# Patient Record
Sex: Male | Born: 1935 | Race: Black or African American | Hispanic: No | Smoking: Former smoker
Health system: Southern US, Community
[De-identification: ages and names within clinical notes are randomized; demographics above are authoritative.]

## PROBLEM LIST (undated history)

## (undated) DIAGNOSIS — J449 Chronic obstructive pulmonary disease, unspecified: Secondary | ICD-10-CM

## (undated) DIAGNOSIS — M545 Low back pain, unspecified: Secondary | ICD-10-CM

## (undated) DIAGNOSIS — F0390 Unspecified dementia without behavioral disturbance: Secondary | ICD-10-CM

## (undated) DIAGNOSIS — M1990 Unspecified osteoarthritis, unspecified site: Secondary | ICD-10-CM

## (undated) DIAGNOSIS — E7800 Pure hypercholesterolemia, unspecified: Secondary | ICD-10-CM

## (undated) DIAGNOSIS — Z789 Other specified health status: Secondary | ICD-10-CM

## (undated) DIAGNOSIS — C61 Malignant neoplasm of prostate: Secondary | ICD-10-CM

## (undated) DIAGNOSIS — Z52008 Unspecified donor, other blood: Secondary | ICD-10-CM

## (undated) DIAGNOSIS — K5730 Diverticulosis of large intestine without perforation or abscess without bleeding: Secondary | ICD-10-CM

## (undated) DIAGNOSIS — N281 Cyst of kidney, acquired: Secondary | ICD-10-CM

## (undated) DIAGNOSIS — K219 Gastro-esophageal reflux disease without esophagitis: Secondary | ICD-10-CM

## (undated) HISTORY — PX: OTHER SURGICAL HISTORY: SHX169

## (undated) HISTORY — DX: Cyst of kidney, acquired: N28.1

## (undated) HISTORY — DX: Low back pain: M54.5

## (undated) HISTORY — DX: Other specified health status: Z78.9

## (undated) HISTORY — DX: Unspecified donor, other blood: Z52.008

## (undated) HISTORY — DX: Diverticulosis of large intestine without perforation or abscess without bleeding: K57.30

## (undated) HISTORY — DX: Chronic obstructive pulmonary disease, unspecified: J44.9

## (undated) HISTORY — DX: Malignant neoplasm of prostate: C61

## (undated) HISTORY — DX: Unspecified osteoarthritis, unspecified site: M19.90

## (undated) HISTORY — DX: Gastro-esophageal reflux disease without esophagitis: K21.9

## (undated) HISTORY — DX: Pure hypercholesterolemia, unspecified: E78.00

## (undated) HISTORY — DX: Low back pain, unspecified: M54.50

---

## 2000-06-14 ENCOUNTER — Encounter: Payer: Self-pay | Admitting: Otolaryngology

## 2000-06-14 ENCOUNTER — Ambulatory Visit (HOSPITAL_COMMUNITY): Admission: RE | Admit: 2000-06-14 | Discharge: 2000-06-14 | Payer: Self-pay | Admitting: Otolaryngology

## 2002-02-10 ENCOUNTER — Encounter: Payer: Self-pay | Admitting: Emergency Medicine

## 2002-02-10 ENCOUNTER — Emergency Department (HOSPITAL_COMMUNITY): Admission: EM | Admit: 2002-02-10 | Discharge: 2002-02-10 | Payer: Self-pay | Admitting: Emergency Medicine

## 2003-12-26 ENCOUNTER — Ambulatory Visit (HOSPITAL_COMMUNITY): Admission: RE | Admit: 2003-12-26 | Discharge: 2003-12-26 | Payer: Self-pay | Admitting: Pulmonary Disease

## 2004-05-03 ENCOUNTER — Ambulatory Visit: Payer: Self-pay | Admitting: Pulmonary Disease

## 2004-10-31 ENCOUNTER — Ambulatory Visit: Payer: Self-pay | Admitting: Pulmonary Disease

## 2005-05-01 ENCOUNTER — Ambulatory Visit: Payer: Self-pay | Admitting: Pulmonary Disease

## 2005-10-21 ENCOUNTER — Ambulatory Visit: Payer: Self-pay | Admitting: Pulmonary Disease

## 2005-12-15 ENCOUNTER — Emergency Department (HOSPITAL_COMMUNITY): Admission: EM | Admit: 2005-12-15 | Discharge: 2005-12-15 | Payer: Self-pay | Admitting: Emergency Medicine

## 2005-12-16 ENCOUNTER — Ambulatory Visit: Payer: Self-pay | Admitting: Pulmonary Disease

## 2006-03-07 ENCOUNTER — Ambulatory Visit: Payer: Self-pay | Admitting: Pulmonary Disease

## 2006-04-23 ENCOUNTER — Ambulatory Visit: Payer: Self-pay | Admitting: Pulmonary Disease

## 2006-04-25 LAB — CONVERTED CEMR LAB
Albumin: 3.9 g/dL (ref 3.5–5.2)
BUN: 10 mg/dL (ref 6–23)
CO2: 33 meq/L — ABNORMAL HIGH (ref 19–32)
Calcium: 9.8 mg/dL (ref 8.4–10.5)
Creatinine, Ser: 1.1 mg/dL (ref 0.4–1.5)
Eosinophils Absolute: 0.3 10*3/uL (ref 0.0–0.6)
HDL: 88.6 mg/dL (ref >39.0)
Hemoglobin: 14.3 g/dL (ref 13.0–17.0)
Lymphocytes Relative: 32 % (ref 12.0–46.0)
MCHC: 34.2 g/dL (ref 30.0–36.0)
MCV: 87.5 fL (ref 78.0–100.0)
Monocytes Relative: 12.5 % — ABNORMAL HIGH (ref 3.0–11.0)
Neutro Abs: 2.5 10*3/uL (ref 1.4–7.7)
Neutrophils Relative %: 49.3 % (ref 43.0–77.0)
TSH: 1.18 microintl units/mL (ref 0.35–5.50)
Triglycerides: 42 mg/dL (ref 0–149)

## 2006-06-11 ENCOUNTER — Ambulatory Visit: Payer: Self-pay | Admitting: Pulmonary Disease

## 2006-10-20 ENCOUNTER — Ambulatory Visit: Payer: Self-pay | Admitting: Pulmonary Disease

## 2007-02-17 ENCOUNTER — Ambulatory Visit: Payer: Self-pay | Admitting: Pulmonary Disease

## 2007-03-20 ENCOUNTER — Telehealth: Payer: Self-pay | Admitting: Pulmonary Disease

## 2007-04-21 DIAGNOSIS — T7840XA Allergy, unspecified, initial encounter: Secondary | ICD-10-CM | POA: Insufficient documentation

## 2007-04-21 DIAGNOSIS — M1990 Unspecified osteoarthritis, unspecified site: Secondary | ICD-10-CM | POA: Insufficient documentation

## 2007-04-21 DIAGNOSIS — M545 Low back pain, unspecified: Secondary | ICD-10-CM | POA: Insufficient documentation

## 2007-04-21 DIAGNOSIS — J449 Chronic obstructive pulmonary disease, unspecified: Secondary | ICD-10-CM | POA: Insufficient documentation

## 2007-04-21 DIAGNOSIS — K219 Gastro-esophageal reflux disease without esophagitis: Secondary | ICD-10-CM | POA: Insufficient documentation

## 2007-04-22 ENCOUNTER — Ambulatory Visit: Payer: Self-pay | Admitting: Pulmonary Disease

## 2007-04-22 DIAGNOSIS — K5730 Diverticulosis of large intestine without perforation or abscess without bleeding: Secondary | ICD-10-CM | POA: Insufficient documentation

## 2007-05-03 DIAGNOSIS — N281 Cyst of kidney, acquired: Secondary | ICD-10-CM | POA: Insufficient documentation

## 2007-05-03 LAB — CONVERTED CEMR LAB
ALT: 21 units/L (ref 0–53)
AST: 31 units/L (ref 0–37)
Alkaline Phosphatase: 43 units/L (ref 39–117)
BUN: 8 mg/dL (ref 6–23)
Basophils Relative: 0.4 % (ref 0.0–1.0)
Bilirubin, Direct: 0.2 mg/dL (ref 0.0–0.3)
Calcium: 9.8 mg/dL (ref 8.4–10.5)
Chloride: 103 meq/L (ref 96–112)
Eosinophils Absolute: 0.2 10*3/uL (ref 0.0–0.6)
Eosinophils Relative: 3.1 % (ref 0.0–5.0)
GFR calc non Af Amer: 76 mL/min
Glucose, Bld: 89 mg/dL (ref 70–99)
HDL: 82.3 mg/dL (ref >39.0)
MCV: 88.8 fL (ref 78.0–100.0)
Monocytes Relative: 7.6 % (ref 3.0–11.0)
Platelets: 187 10*3/uL (ref 150–400)
RBC: 4.26 M/uL (ref 4.22–5.81)
Triglycerides: 45 mg/dL (ref 0–149)
VLDL: 9 mg/dL (ref 0–40)
WBC: 6.1 10*3/uL (ref 4.5–10.5)

## 2007-05-05 ENCOUNTER — Ambulatory Visit: Payer: Self-pay | Admitting: Pulmonary Disease

## 2007-05-05 LAB — CONVERTED CEMR LAB
OCCULT 1: NEGATIVE
OCCULT 2: NEGATIVE
OCCULT 3: NEGATIVE
OCCULT 4: NEGATIVE
OCCULT 5: NEGATIVE

## 2007-05-07 ENCOUNTER — Telehealth: Payer: Self-pay | Admitting: Pulmonary Disease

## 2007-05-26 ENCOUNTER — Encounter: Payer: Self-pay | Admitting: Pulmonary Disease

## 2007-10-12 ENCOUNTER — Telehealth (INDEPENDENT_AMBULATORY_CARE_PROVIDER_SITE_OTHER): Payer: Self-pay | Admitting: *Deleted

## 2007-10-20 ENCOUNTER — Ambulatory Visit: Payer: Self-pay | Admitting: Pulmonary Disease

## 2007-10-20 DIAGNOSIS — E785 Hyperlipidemia, unspecified: Secondary | ICD-10-CM | POA: Insufficient documentation

## 2007-10-26 LAB — CONVERTED CEMR LAB
LDL Cholesterol: 105 mg/dL — ABNORMAL HIGH (ref 0–99)
PSA: 6 ng/mL — ABNORMAL HIGH (ref 0.10–4.00)
Total CHOL/HDL Ratio: 2.4

## 2007-10-30 ENCOUNTER — Encounter: Payer: Self-pay | Admitting: Pulmonary Disease

## 2007-12-09 ENCOUNTER — Encounter: Payer: Self-pay | Admitting: Pulmonary Disease

## 2007-12-11 ENCOUNTER — Encounter: Payer: Self-pay | Admitting: Pulmonary Disease

## 2008-01-04 ENCOUNTER — Encounter: Payer: Self-pay | Admitting: Pulmonary Disease

## 2008-04-21 ENCOUNTER — Ambulatory Visit: Payer: Self-pay | Admitting: Pulmonary Disease

## 2008-04-21 LAB — CONVERTED CEMR LAB
AST: 35 units/L (ref 0–37)
Albumin: 3.8 g/dL (ref 3.5–5.2)
Alkaline Phosphatase: 45 units/L (ref 39–117)
BUN: 15 mg/dL (ref 6–23)
Basophils Absolute: 0.1 10*3/uL (ref 0.0–0.1)
Chloride: 103 meq/L (ref 96–112)
Cholesterol: 211 mg/dL (ref 0–200)
Creatinine, Ser: 1.1 mg/dL (ref 0.4–1.5)
Direct LDL: 99 mg/dL
Eosinophils Absolute: 0.3 10*3/uL (ref 0.0–0.7)
GFR calc Af Amer: 82 mL/min
GFR calc non Af Amer: 68 mL/min
HCT: 40.8 % (ref 39.0–52.0)
HDL: 90.8 mg/dL (ref >39.0)
MCHC: 33.8 g/dL (ref 30.0–36.0)
MCV: 88.4 fL (ref 78.0–100.0)
Monocytes Absolute: 0.7 10*3/uL (ref 0.1–1.0)
Neutrophils Relative %: 48.4 % (ref 43.0–77.0)
Platelets: 171 10*3/uL (ref 150–400)
Potassium: 4.7 meq/L (ref 3.5–5.1)
Total Bilirubin: 1 mg/dL (ref 0.3–1.2)
Triglycerides: 33 mg/dL (ref 0–149)

## 2008-05-20 ENCOUNTER — Telehealth (INDEPENDENT_AMBULATORY_CARE_PROVIDER_SITE_OTHER): Payer: Self-pay | Admitting: *Deleted

## 2008-07-11 ENCOUNTER — Encounter: Payer: Self-pay | Admitting: Pulmonary Disease

## 2008-09-05 ENCOUNTER — Telehealth (INDEPENDENT_AMBULATORY_CARE_PROVIDER_SITE_OTHER): Payer: Self-pay | Admitting: *Deleted

## 2008-12-14 ENCOUNTER — Encounter: Payer: Self-pay | Admitting: Pulmonary Disease

## 2009-03-03 ENCOUNTER — Ambulatory Visit: Payer: Self-pay | Admitting: Pulmonary Disease

## 2009-05-18 ENCOUNTER — Ambulatory Visit: Payer: Self-pay | Admitting: Pulmonary Disease

## 2009-05-18 DIAGNOSIS — C61 Malignant neoplasm of prostate: Secondary | ICD-10-CM | POA: Insufficient documentation

## 2009-05-19 LAB — CONVERTED CEMR LAB
Albumin: 3.9 g/dL (ref 3.5–5.2)
Basophils Absolute: 0.1 10*3/uL (ref 0.0–0.1)
Basophils Relative: 0.8 % (ref 0.0–3.0)
CO2: 32 meq/L (ref 19–32)
Calcium: 9.5 mg/dL (ref 8.4–10.5)
Cholesterol: 220 mg/dL — ABNORMAL HIGH (ref 0–200)
Creatinine, Ser: 1 mg/dL (ref 0.4–1.5)
Eosinophils Relative: 2.3 % (ref 0.0–5.0)
GFR calc non Af Amer: 91.55 mL/min (ref >60)
Glucose, Bld: 83 mg/dL (ref 70–99)
HCT: 41.4 % (ref 39.0–52.0)
HDL: 86.2 mg/dL (ref >39.00)
Hemoglobin: 13.6 g/dL (ref 13.0–17.0)
Lymphocytes Relative: 20.9 % (ref 12.0–46.0)
Lymphs Abs: 1.4 10*3/uL (ref 0.7–4.0)
Monocytes Relative: 8.1 % (ref 3.0–12.0)
Neutro Abs: 4.6 10*3/uL (ref 1.4–7.7)
PSA: 4.72 ng/mL — ABNORMAL HIGH (ref 0.10–4.00)
RBC: 4.61 M/uL (ref 4.22–5.81)
Saturation Ratios: 23.8 % (ref 20.0–50.0)
Sodium: 140 meq/L (ref 135–145)
Total Protein: 7.9 g/dL (ref 6.0–8.3)
Transferrin: 207.1 mg/dL — ABNORMAL LOW (ref 212.0–360.0)
Triglycerides: 55 mg/dL (ref 0.0–149.0)
WBC: 6.9 10*3/uL (ref 4.5–10.5)

## 2009-07-12 ENCOUNTER — Encounter: Payer: Self-pay | Admitting: Pulmonary Disease

## 2009-09-15 ENCOUNTER — Encounter: Payer: Self-pay | Admitting: Pulmonary Disease

## 2010-03-09 ENCOUNTER — Ambulatory Visit: Payer: Self-pay | Admitting: Pulmonary Disease

## 2010-03-19 ENCOUNTER — Encounter: Payer: Self-pay | Admitting: Pulmonary Disease

## 2010-05-01 NOTE — Letter (Signed)
Summary: [facility]  [facility]   Imported By: Lennie Odor 07/25/2009 14:58:33  _____________________________________________________________________  External Attachment:    Type:   Image     Comment:   External Document

## 2010-05-01 NOTE — Letter (Signed)
Summary: [facility]   Imported By: Sherian Rein 09/21/2009 11:05:50  _____________________________________________________________________  External Attachment:    Type:   Image     Comment:   External Document

## 2010-05-01 NOTE — Assessment & Plan Note (Signed)
Summary: Bradley Meza   CC:  13 month ROV & review of medical problems....  History of Present Illness: [name] here for a 6 month follow up visit... he is in great shape for 77 y/o and only takes an aspirin and vitamin daily...    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 DrWrenn plans f/u PSA & exam in 6months... Bradley Meza feels well- no new complaints or concerns today...   ~  May 18, 2009:  Daud continues to feel well- no new complaints or concerns... he has known prostate cancer on bx by DrWrenn 9/09 w/ PSA= 6.52... he saw DrWrenn back in the office 4/10 w/ neg exam & PSA= 3.67- he rec continued observation & f/u 38yr... he saw DrPaul w/ left shoulder pain 9/10- MRI w/ sm rotator cuff tear & rec conservative approach w/ anti-inflamm/ PT, etc... he requests refill for MMW today, & due for f/u CXR/ labs...    Current Problem List:  ALLERGY (ICD-995.3) - uses OTC meds as needed.  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  COPD (ICD-496) - ex-smoker, no signif symptoms... denies cough, sputum, hemoptysis, worsening dyspnea,  wheezing, chest pains, snoring, daytime hypersomnolence, etc... he exercises regularly playing golf, yard work, etc...  HYPERCHOLESTEROLEMIA, BORDERLINE (ICD-272.4) - on diet alone & he has a high HDL...  ~  FLP 1/08 showed TChol 226, TG 42, HDL 89, LDL 110... rec- diet.  ~  FLP 1/09 showed TChol 228, TG 45, HDL 82, LDL 109... rec- continue same, needs better diet.  ~  FLP 1/10 showed TChol 211, TG 33, HDL 91, LDL 99  ~  FLP 2/11 showed TChol 220, TG 55, HDL 86, LDL 119... rec> better diet vs low dose statin.  GERD (ICD-530.81) - he had an EGD in 1986 showing 4-5cm HH, reflux esophagitis, and antritis... he denies abd pain, nausea, vomiting, gas,  bloating, change in bowel habits, etc... he uses Zantac/ Pepcid as needed...  DIVERTICULOSIS OF COLON (ICD-562.10) - last colonoscopy 3/02  showed only divertics... stool cards completed Feb09 were neg x6...  PROSTATE CANCER (ICD-185) & ELEVATED PROSTATE SPECIFIC ANTIGEN (ICD-790.93) - he is asymtpomatic, denies LTOS, etc... followed by DrWrenn for Urology w/ Dx= Prostate ca, Elevated PSA, Testic atrophy, Organic impotence...  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 they chose OBSERVATION.  ~  f/u Urology 4/10 showed neg exam and PSA= 3.67... he rec f/u 64yr.  ~  labs here 2/11 showed PSA= 4.72 > copy to DrWrenn  RENAL CYST (ICD-593.2)  DEGENERATIVE JOINT DISEASE (ICD-715.90) - old XRays w/ DJD in hips and L/S spine... prob w/ R Shoulder reported and pt saw DrPaul w/ shot given (improved)... then developed left shoulder pain- eval by DrPaul 9/10 w/ MRI showing sm rotator cuff tear according to pt- Rx w/ anti-inflamm & PT...  BACK PAIN, LUMBAR (ICD-724.2)  BLOOD DONOR - he is B+ and donates  ~ Q74mo...  HEALTH MAINTENANCE:  he thinks he  had Pneumovax and Tetanus shots at the health dept in early 2000's... we gave him the H1N1 vaccine in 08/29/07, and the 2008-08-28 seasonal vaccine 12/10...   Allergies (verified): No Known Drug Allergies  Comments:  Nurse/Medical Assistant: The patient's medications and allergies were reviewed with the patient and were updated in the Medication and Allergy Lists.  Past History:  Past Medical History:  ALLERGY (ICD-995.3) COPD (ICD-496) HYPERCHOLESTEROLEMIA, BORDERLINE (ICD-272.4) GERD (ICD-530.81) DIVERTICULOSIS OF COLON (ICD-562.10) PROSTATE CANCER (ICD-185) RENAL CYST (ICD-593.2) DEGENERATIVE JOINT  DISEASE (ICD-715.90) BACK PAIN, LUMBAR (ICD-724.2) BLOOD DONOR, OTHER (ICD-V59.09)  Past Surgical History: S/P RIH Repair  Family History: Father - ?hx, died when pt was very young... Mother, died age 45, from CHF 1 Sibling- Brother, died age 27 w/ heart dis & Alzheimers  Social History: Widower- wife died 08-29-07 3 Children-  1 daugh died in MVA age 50 1 son & daugh alive & well Ex-smoker, quit 90 Social alcohol Exercises regularly Retired Banker  Review of Systems      See HPI  The patient denies anorexia, fever, weight loss, weight gain, vision loss, decreased hearing, hoarseness, chest pain, syncope, dyspnea on exertion, peripheral edema, prolonged cough, headaches, hemoptysis, abdominal pain, melena, hematochezia, severe indigestion/heartburn, hematuria, incontinence, muscle weakness, suspicious skin lesions, transient blindness, difficulty walking, depression, unusual weight change, abnormal bleeding, enlarged lymph nodes, and angioedema.    Vital Signs:  Patient profile:   77 year old male Height:      72 inches Weight:      186.38 pounds O2 Sat:      97 % on Room air Temp:     97.3 degrees F oral Pulse rate:   64 / minute BP sitting:   136 / 72  (left arm) Cuff size:   regular  Vitals Entered By: Randell Loop CMA (May 18, 2009 11:24 AM)  O2 Sat at Rest %:  97 O2 Flow:  Room air CC: 13 month ROV & review of medical problems... Is Patient Diabetic? No Pain Assessment Patient in pain? no      Comments meds updated today   Physical Exam  Additional Exam:  WD, WN, [name] in NAD... GENERAL:  Alert & oriented; pleasant & cooperative... HEENT:  [place]/AT, EOM-wnl, PERRLA, EACs-clear, TMs-wnl, NOSE-clear, THROAT-clear & wnl. NECK:  Supple w/ fairROM; no JVD; normal carotid impulses w/o bruits; no thyromegaly or nodules palpated; no lymphadenopathy. CHEST:  Clear to P & A; without wheezes/ rales/ or rhonchi. HEART:  Regular Rhythm; without murmurs/  rubs/ or gallops. ABDOMEN:  Soft & nontender; normal bowel sounds; no organomegaly or masses detected. EXT: without deformities, mild arthritic changes; no varicose veins/ venous insuffic/ or edema. NEURO:  CN's intact; no focal neuro deficits... DERM:  No lesions noted; no rash etc...    CXR  Procedure date:  05/18/2009  Findings:      CHEST - 2 VIEW Comparison: PA and lateral chest 05/13/2000.   Findings: Chest is hyperexpanded but the lungs are clear.  Heart size is normal.  No pleural effusion.   IMPRESSION: Findings compatible with COPD.  No acute disease.   Read By:  Charyl Dancer,  M.D.   MISC. Report  Procedure date:  05/18/2009  Findings:      Lipid Panel (LIPID)   Cholesterol          [H]  220 mg/dL                   8-841  Triglycerides             55.0 mg/dL                  1.6-109.6   HDL                       04.54 mg/dL                 >09.81 Cholesterol LDL - Direct                             119.1 mg/dL  BMP (METABOL)   Sodium                    140 mEq/L                   135-145   Potassium                 4.5 mEq/L                   3.5-5.1   Chloride                  104 mEq/L                   96-112   Carbon Dioxide            32 mEq/L                    19-32   Glucose                   83 mg/dL                    19-14   BUN                       8 mg/dL                     7-82   Creatinine                1.0 mg/dL                   9.5-6.2   Calcium                   9.5 mg/dL                   1.3-08.6   GFR                       91.55 mL/min                >60  Hepatic/Liver Function Panel (HEPATIC)   Total Bilirubin           0.9 mg/dL                   5.7-8.4   Direct Bilirubin          0.2 mg/dL                   6.9-6.2   Alkaline Phosphatase      50 U/L                      39-117   AST  32 U/L                      0-37   ALT                       21 U/L                      0-53   Total Protein              7.9 g/dL                    2.9-5.6   Albumin                   3.9 g/dL                    2.1-3.0  Comments:      CBC Platelet w/Diff (CBCD)   White Cell Count          6.9 K/uL                    4.5-10.5   Red Cell Count            4.61 Mil/uL                 4.22-5.81   Hemoglobin                13.6 g/dL                   86.5-78.4   Hematocrit                41.4 %                      39.0-52.0   MCV                       89.9 fl                     78.0-100.0   Platelet Count       [L]  145.0 K/uL                  150.0-400.0   Neutrophil %              67.9 %                      43.0-77.0   Lymphocyte %              20.9 %                      12.0-46.0   Monocyte %                8.1 %                       3.0-12.0   Eosinophils%              2.3 %                       0.0-5.0   Basophils %               0.8 %  0.0-3.0  TSH (TSH)   FastTSH                   0.72 uIU/mL                 0.35-5.50   IBC Panel (IBC)   Iron                      69 ug/dL                    16-109   Transferrin          [L]  207.1 mg/dL                 604.5-409.8   Iron Saturation           23.8 %                      20.0-50.0  Prostate Specific Antigen (PSA)   PSA-Hyb              [H]  4.72 ng/mL                  0.10-4.00   Impression & Recommendations:  Problem # 1:  COPD (ICD-496) Stable-  no acute problems... he gets plenty of exercise & doing well... Orders: T-2 View CXR (71020TC)  Problem # 2:  HYPERCHOLESTEROLEMIA, BORDERLINE (ICD-272.4) FLP is borderline on diet w/ his high HDL.[name]. offered low dose statin vs continue diet efforts & he will decide. Orders: TLB-Lipid Panel (80061-LIPID) TLB-BMP (Basic Metabolic Panel-BMET) (80048-METABOL) TLB-Hepatic/Liver Function Pnl (80076-HEPATIC) TLB-CBC Platelet - w/Differential (85025-CBCD) TLB-TSH (Thyroid Stimulating Hormone) (84443-TSH) TLB-IBC Pnl (Iron/FE;Transferrin) (83550-IBC) TLB-PSA (Prostate  Specific Antigen) (84153-PSA)  Problem # 3:  GERD (ICD-530.81) Stable on OTC acid suppressors as needed...  Problem # 4:  DIVERTICULOSIS OF COLON (ICD-562.10) Last colon 2002 & f/u colon due 2012...  Problem # 5:  PROSTATE CANCER (ICD-185) Followed by DrWrenn... yearly ROV due 4/11... PSA today (4.72) w/ copy to DrWrenn.  Problem # 6:  DEGENERATIVE JOINT DISEASE (ICD-715.90) He has seen DrPaul w/ some shots in the past... CC is his left shoulder rotator cuff... doing his own PT + anti-inflamm Prn... His updated medication list for this problem includes:    Aspirin 81 Mg Tbec (Aspirin) .[name]... 1 tab daily  Problem # 7:  OTHER MEDICAL PROBLEMS AS NOTED>>>  Complete Medication List: 1)  Aspirin 81 Mg Tbec (Aspirin) .[name].. 1 tab daily 2)  Centrum Silver Tabs (Multiple vitamins-minerals) .... Take 1 tablet by mouth once a day 3)  Vitamin D3 1000 Unit Caps (Cholecalciferol) .... Take one capsule by mouth once daily 4)  Magic Mouthwash  .... 1 tsp gargle & swallow up to 4 times daily as needed...  Other Orders: Prescription Created Electronically [id number])  Patient Instructions: 1)  Today we wrote a new perscription for the Magic Mouthwash to use as needed... 2)  We did your follow up CXR & FASTING blood work... please call the "phone tree" in a few days for your lab results.Marland KitchenMarland Kitchen 3)  Continue your exercise program, and low fat diet.Marland KitchenMarland Kitchen 4)  Call for any problems.Marland KitchenMarland Kitchen 5)  Please schedule a follow-up appointment in 1 year. Prescriptions: MAGIC MOUTHWASH 1 tsp gargle & swallow up to 4 times daily as needed...  #4 oz x prn   Entered and Authorized by:   Michele Mcalpine MD   Signed by:   Michele Mcalpine  MD on 05/18/2009   Method used:   Print then Give to Patient   RxID:   [id number]

## 2010-05-03 NOTE — Letter (Signed)
[facility]   Imported By: Sherian Rein 04/05/2010 09:55:56  _____________________________________________________________________  External Attachment:    Type:   Image     Comment:   External Document

## 2010-05-03 NOTE — Assessment & Plan Note (Signed)
Summary: flu shot/mhh  Nurse Visit   Allergies: No Known Drug Allergies  Immunizations Administered:  Influenza Vaccine # 1:    Vaccine Type: Fluvax MCR    Site: right deltoid    Mfr: [email or web address]    Dose: 0.5 ml    Given by: Clarise Cruz    Exp. Date: 09/29/2010    Lot #: [id number]    VIS given: 10/24/09 version given March 12, 2010.  Flu Vaccine Consent Questions:    Do you have a history of severe allergic reactions to this vaccine? no    Any prior history of allergic reactions to egg and/or gelatin? no    Do you have a sensitivity to the preservative Thimersol? no    Do you have a past history of Guillan-Barre Syndrome? no    Do you currently have an acute febrile illness? no    Have you ever had a severe reaction to latex? no    Vaccine information given and explained to patient? yes  Orders Added: 1)  Influenza Vaccine MCR [00025]

## 2010-05-18 ENCOUNTER — Ambulatory Visit (INDEPENDENT_AMBULATORY_CARE_PROVIDER_SITE_OTHER): Payer: Medicare Other | Admitting: Pulmonary Disease

## 2010-05-18 ENCOUNTER — Encounter: Payer: Self-pay | Admitting: Pulmonary Disease

## 2010-05-18 ENCOUNTER — Other Ambulatory Visit: Payer: Self-pay | Admitting: Pulmonary Disease

## 2010-05-18 DIAGNOSIS — J449 Chronic obstructive pulmonary disease, unspecified: Secondary | ICD-10-CM

## 2010-05-18 DIAGNOSIS — N281 Cyst of kidney, acquired: Secondary | ICD-10-CM

## 2010-05-18 DIAGNOSIS — E785 Hyperlipidemia, unspecified: Secondary | ICD-10-CM

## 2010-05-18 DIAGNOSIS — M1990 Unspecified osteoarthritis, unspecified site: Secondary | ICD-10-CM

## 2010-05-18 DIAGNOSIS — T7840XA Allergy, unspecified, initial encounter: Secondary | ICD-10-CM

## 2010-05-18 DIAGNOSIS — C61 Malignant neoplasm of prostate: Secondary | ICD-10-CM

## 2010-05-18 DIAGNOSIS — K5730 Diverticulosis of large intestine without perforation or abscess without bleeding: Secondary | ICD-10-CM

## 2010-05-18 DIAGNOSIS — M545 Low back pain, unspecified: Secondary | ICD-10-CM

## 2010-05-18 DIAGNOSIS — K219 Gastro-esophageal reflux disease without esophagitis: Secondary | ICD-10-CM

## 2010-05-18 DIAGNOSIS — Z0000 Encounter for general adult medical examination without abnormal findings: Secondary | ICD-10-CM

## 2010-05-18 LAB — HEPATIC FUNCTION PANEL
Albumin: 4 g/dL (ref 3.5–5.2)
Alkaline Phosphatase: 43 U/L (ref 39–117)
Total Bilirubin: 0.7 mg/dL (ref 0.3–1.2)

## 2010-05-18 LAB — BASIC METABOLIC PANEL
Calcium: 9.8 mg/dL (ref 8.4–10.5)
GFR: 84.47 mL/min (ref >60.00)
Glucose, Bld: 81 mg/dL (ref 70–99)
Sodium: 141 mEq/L (ref 135–145)

## 2010-05-18 LAB — CBC WITH DIFFERENTIAL/PLATELET
Basophils Absolute: 0 10*3/uL (ref 0.0–0.1)
Eosinophils Relative: 2.4 % (ref 0.0–5.0)
HCT: 41.1 % (ref 39.0–52.0)
Lymphocytes Relative: 37.1 % (ref 12.0–46.0)
Monocytes Relative: 9 % (ref 3.0–12.0)
Neutrophils Relative %: 50.9 % (ref 43.0–77.0)
Platelets: 167 10*3/uL (ref 150.0–400.0)
RDW: 14.5 % (ref 11.5–14.6)
WBC: 5.8 10*3/uL (ref 4.5–10.5)

## 2010-05-18 LAB — LIPID PANEL
Cholesterol: 206 mg/dL — ABNORMAL HIGH (ref 0–200)
HDL: 93.4 mg/dL (ref >39.00)
Total CHOL/HDL Ratio: 2
Triglycerides: 34 mg/dL (ref 0.0–149.0)
VLDL: 6.8 mg/dL (ref 0.0–40.0)

## 2010-05-23 NOTE — Assessment & Plan Note (Signed)
Summary: 1 yr reck//klw   CC:  Yearly ROV & review of mult medical problems....  History of Present Illness: [name] here for a yearly ROV... 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 DrWrenn plans f/u PSA & exam in 6months... Bradley Meza feels well- no new complaints or concerns today...   ~  May 18, 2009:  Ples continues to feel well- no new complaints or concerns... he has ?prostate cancer on bx by DrWrenn 9/09 w/ PSA= 6.52... he saw DrWrenn back in the office 4/10 w/ neg exam & PSA= 3.67- he rec continued observation & f/u 68yr... he saw DrPaul w/ left shoulder pain 9/10- MRI w/ sm rotator cuff tear & rec conservative approach w/ anti-inflamm/ PT, etc... he requests refill for MMW today, & due for f/u CXR/ labs...   ~  May 18, 2010:  Bradley Meza is doing very well at 62!  no new complaints or concerns... he saw DrWrenn 12/11 & PSA was 2.98 (also followed for kidney stone, bladder stone, microheme, testic atrophy)... he denies CP, palpit, SOB, cough, phlegm, etc... FLP looks good w/ very hi HDL.[name]. he maintains an active lifestyle & is quite remarkable...    Current Problem List:  ALLERGY (ICD-995.3) - uses OTC meds as needed.  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  COPD (ICD-496) - ex-smoker, no signif symptoms... denies cough, sputum, hemoptysis, worsening dyspnea,  wheezing, chest pains, snoring, daytime hypersomnolence, etc... he exercises regularly playing golf, yard work, etc...  ~  CXR 2/11 showed clear lungs, some hyperinflation, normal heart, NAD...  HYPERCHOLESTEROLEMIA, BORDERLINE (ICD-272.4) - on diet alone & he has a high HDL...  ~  FLP 1/08 showed TChol 226, TG 42, HDL 89, LDL 110... rec- diet.  ~  FLP  1/09 showed TChol 228, TG 45, HDL 82, LDL 109... rec- continue same, needs better diet.  ~  FLP 1/10 showed TChol 211, TG 33, HDL 91, LDL 99  ~  FLP 2/11 showed TChol 220, TG 55, HDL 86, LDL 119... rec> better diet vs low dose statin.  ~  FLP 2/12 showed TChol 206, TG 34, HDL 93, LDL 98  GERD (ICD-530.81) - he had an EGD in 1986 showing 4-5cm HH, reflux esophagitis, and antritis... he denies abd pain, nausea, vomiting, gas, bloating, change in bowel habits, etc... he uses Zantac/ Pepcid as needed...  DIVERTICULOSIS OF COLON (ICD-562.10) - last colonoscopy 3/02  showed only divertics... stool cards completed Feb09 were neg x6...  PROSTATE CANCER (ICD-185) & ELEVATED PROSTATE SPECIFIC ANTIGEN (ICD-790.93) - he is asymtpomatic, denies LTOS, etc... followed by DrWrenn for Urology w/ Dx= Prostate ca, Elevated PSA, Testic atrophy, Organic impotence...  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 they chose OBSERVATION.  ~  f/u Urology 4/10 showed neg exam and PSA= 3.67... he rec f/u 53yr.  ~  labs here 2/11 showed PSA= 4.72 > copy to DrWrenn  ~  labs from DrWrenn 12/11 showed PSA= 2.98  RENAL CYST (ICD-593.2)  DEGENERATIVE JOINT DISEASE (ICD-715.90) - old XRays w/ DJD in hips and L/S spine... prob w/ R Shoulder reported and pt saw DrPaul w/ shot given (improved)... then developed left shoulder pain- eval by DrPaul 9/10 w/ MRI showing sm rotator cuff tear according to pt- Rx w/ anti-inflamm & PT...  BACK PAIN, LUMBAR (ICD-724.2)  BLOOD DONOR - he is B+ and donates  ~ Q49mo...  HEALTH MAINTENANCE:  he thinks he had Pneumovax and Tetanus shots at the health dept in early 2000's... he gets the season Flu vaccines each fall... he takes ASA, MVI, Vit D  supplements...   Current Medications (verified): 1)  Aspirin 81 Mg  Tbec (Aspirin) .[name].. 1 Tab Daily  Allergies (verified): No Known Drug Allergies  Past History:  Past Medical History: ALLERGY (ICD-995.3) COPD (ICD-496) HYPERCHOLESTEROLEMIA, BORDERLINE (ICD-272.4) GERD (ICD-530.81) DIVERTICULOSIS OF COLON (ICD-562.10) PROSTATE CANCER (ICD-185) RENAL CYST (ICD-593.2) DEGENERATIVE JOINT DISEASE (ICD-715.90) BACK PAIN, LUMBAR (ICD-724.2) BLOOD DONOR, OTHER (ICD-V59.09)  Past Surgical History: S/P RIH Repair  Family History: Reviewed history from 05/18/2009 and no changes required. Father - ?hx, died when pt was very young... Mother, died age 74, from CHF 1 Sibling- Brother, died age 11 w/ heart dis & Alzheimers  Social History: Reviewed history from 05/18/2009 and no changes required. Widower- wife died 08/15/2007 3 Children-  1 daugh died in MVA age 40 1 son & daugh alive & well Ex-smoker, quit 1988/08/14 Social alcohol Exercises regularly Retired Banker  Review of Systems      See HPI       The patient complains of decreased hearing.  The patient denies anorexia, fever, weight loss, weight gain, vision loss, hoarseness, chest pain, syncope, dyspnea on exertion, peripheral edema, prolonged cough, headaches, hemoptysis, abdominal pain, melena, hematochezia, severe indigestion/heartburn, hematuria, incontinence, muscle weakness, suspicious skin lesions, transient blindness, difficulty walking, depression, unusual weight change, abnormal bleeding, enlarged lymph nodes, and angioedema.    Vital Signs:  Patient profile:   77 year old male Height:      72 inches Weight:      177.13 pounds BMI:     24.11 O2 Sat:      98 % on Room air Temp:     97.1 degrees F oral Pulse rate:   48 / minute BP sitting:   150 / 80  (right arm) Cuff size:   regular  Vitals Entered By: Kandice Hams CMA (May 18, 2010 10:29 AM)  O2 Flow:  Room air  Physical Exam  Additional Exam:   WD, WN, [name] in NAD... GENERAL:  Alert & oriented; pleasant & cooperative... HEENT:  [place]/AT, EOM-wnl, PERRLA, EACs-clear, TMs-wnl, NOSE-clear, THROAT-clear & wnl. NECK:  Supple w/ fairROM; no JVD; normal carotid impulses w/o bruits; no thyromegaly or nodules palpated; no lymphadenopathy. CHEST:  Clear to P & A; without wheezes/ rales/ or rhonchi. HEART:  Regular Rhythm; without murmurs/ rubs/ or gallops. ABDOMEN:  Soft & nontender; normal bowel sounds; no organomegaly or masses detected. EXT: without deformities, mild arthritic changes; no varicose veins/ venous insuffic/ or edema. NEURO:  CN's intact; no focal neuro deficits... DERM:  No lesions noted; no rash etc...    Impression & Recommendations:  Problem # 1:  COPD (ICD-496) Ex-smoker, clear lungs & neg CXR 2/11... stable & active... continue exercise program...  Problem # 2:  HYPERCHOLESTEROLEMIA, BORDERLINE (ICD-272.4) FLP looks good & HDL remains excellent... Orders: TLB-Hepatic/Liver Function Pnl (80076-HEPATIC) TLB-CBC Platelet -  w/Differential (85025-CBCD) TLB-Lipid Panel (80061-LIPID) TLB-TSH (Thyroid Stimulating Hormone) (84443-TSH)  Problem # 3:  GERD (ICD-530.81) Known HH but asymtpmatic & uses OTC H2Blockers as needed...  Problem # 4:  DIVERTICULOSIS OF COLON (ICD-562.10) Asymptomatic> no need for colonoscopy in this 77 y/o gentleman...  Problem # 5:  PROSTATE CANCER (ICD-185) Followed by DrWrenn & doing very well w/ decr PSA to norm...  Problem # 6:  DEGENERATIVE JOINT DISEASE (ICD-715.90) He does satis w/ OTC meds... His updated medication list for this problem includes:    Aspirin 81 Mg Tbec (Aspirin) .[name]... 1 tab daily  Problem # 7:  OTHER MEDICAL PROBLEMS AS NOTED>>>  Complete Medication List: 1)  Aspirin 81 Mg Tbec (Aspirin) .[name].. 1 tab daily  Other Orders: TLB-BMP (Basic Metabolic Panel-BMET) (80048-METABOL)  Patient Instructions: 1)  Continue your Asprin, Multivit, & any supplements that you  like... 2)  Keep up the  great job w/ diet & exercise.Marland KitchenMarland Kitchen 3)  Today we did your follow up FASTING blood work... please call the "phone tree" in a few days for your lab results.Marland KitchenMarland Kitchen 4)  Call for any problems.Marland KitchenMarland Kitchen 5)  Please schedule a follow-up appointment in 1 year, sooner as needed.Marland KitchenMarland Kitchen

## 2010-08-17 NOTE — Assessment & Plan Note (Signed)
[facility]                               PULMONARY OFFICE NOTE   NAME:BUTLERShuan, Statzer                         MRN:          [id number]  DATE:12/16/2005                            DOB:          27-Sep-1923    HISTORY OF PRESENT ILLNESS:  This is an 77 year old, African-American male  patient of Dr. Kriste Basque who presents for acute office visit.  The patient  complains that over the last 2 days, he has had lower abdominal cramping,  pain, nausea and constipation.  The patient was seen in the emergency room  yesterday and had an episode of nausea, vomiting and no bowel movement.  The  patient underwent a CT scan of his abdomen which revealed fecal material  extending to the cecum and ascending colon and prostatic enlargement.  Also,  he had a right nephrolithiasis and a right hydronephrosis without evidence  of obstructing proximal ureteral calculus.  The patient was recommended to  use Reglan x10 days and begin GoLYTELY.  The patient has not yet filled his  prescription, but does report he feels somewhat improved and has had  resolved right-sided pain.  The patient denies any fever, bloody stools,  vomiting or urinary symptoms.   PAST MEDICAL HISTORY:  Reviewed.   CURRENT MEDICATIONS:  Reviewed.   PHYSICAL EXAMINATION:  GENERAL:  The patient is an elderly male in no acute  distress.  VITAL SIGNS:  Afebrile with stable vital signs.  O2 saturations 98% on room  air.  HEENT:  Unremarkable.  NECK:  Supple without adenopathy, no JVD.  LUNGS:  Lung sounds are clear.  CARDIAC:  Regular rate and rhythm.  ABDOMEN:  Soft without any hepatosplenomegaly.  Bowel sounds are positive  throughout all four quadrants.  No guarding or rebound noted.  No abdominal  masses or bruits are appreciated.  EXTREMITIES:  Warm without any clubbing, cyanosis or edema.   IMPRESSION:  Abdominal discomfort secondary to suspected constipation.   PLAN:  The patient is to increase his  fluid intake.  He may use Reglan over  the next 10 days as prescribed.  Also, will begin GoLYTELY today.  The  patient is advised on a high fiber diet and to begin a stool softener.  If  symptoms persistent, may need MiraLax on a daily basis.  This could be a  good choice for this patient.  The patient will recheck here in 2 weeks with  Dr. Kriste Basque or  sooner if needed.  The patient is advised if his symptoms do not improve or  worsen, he is to contact us immediately.                                    Rubye Oaks, NP                                Lonzo Cloud. Kriste Basque, MD  TP/MedQ  DD:  12/16/2005 DT:  12/17/2005 Job #:  [id number] 

## 2011-05-02 IMAGING — CR DG CHEST 2V
2 series · 2 of 2 positions shown · non-contrast
Comparison: PA and lateral chest 05/13/2000.

CLINICAL DATA: Physical exam.  COPD.

CHEST - 2 VIEW

[view not recorded (1 of 2)]
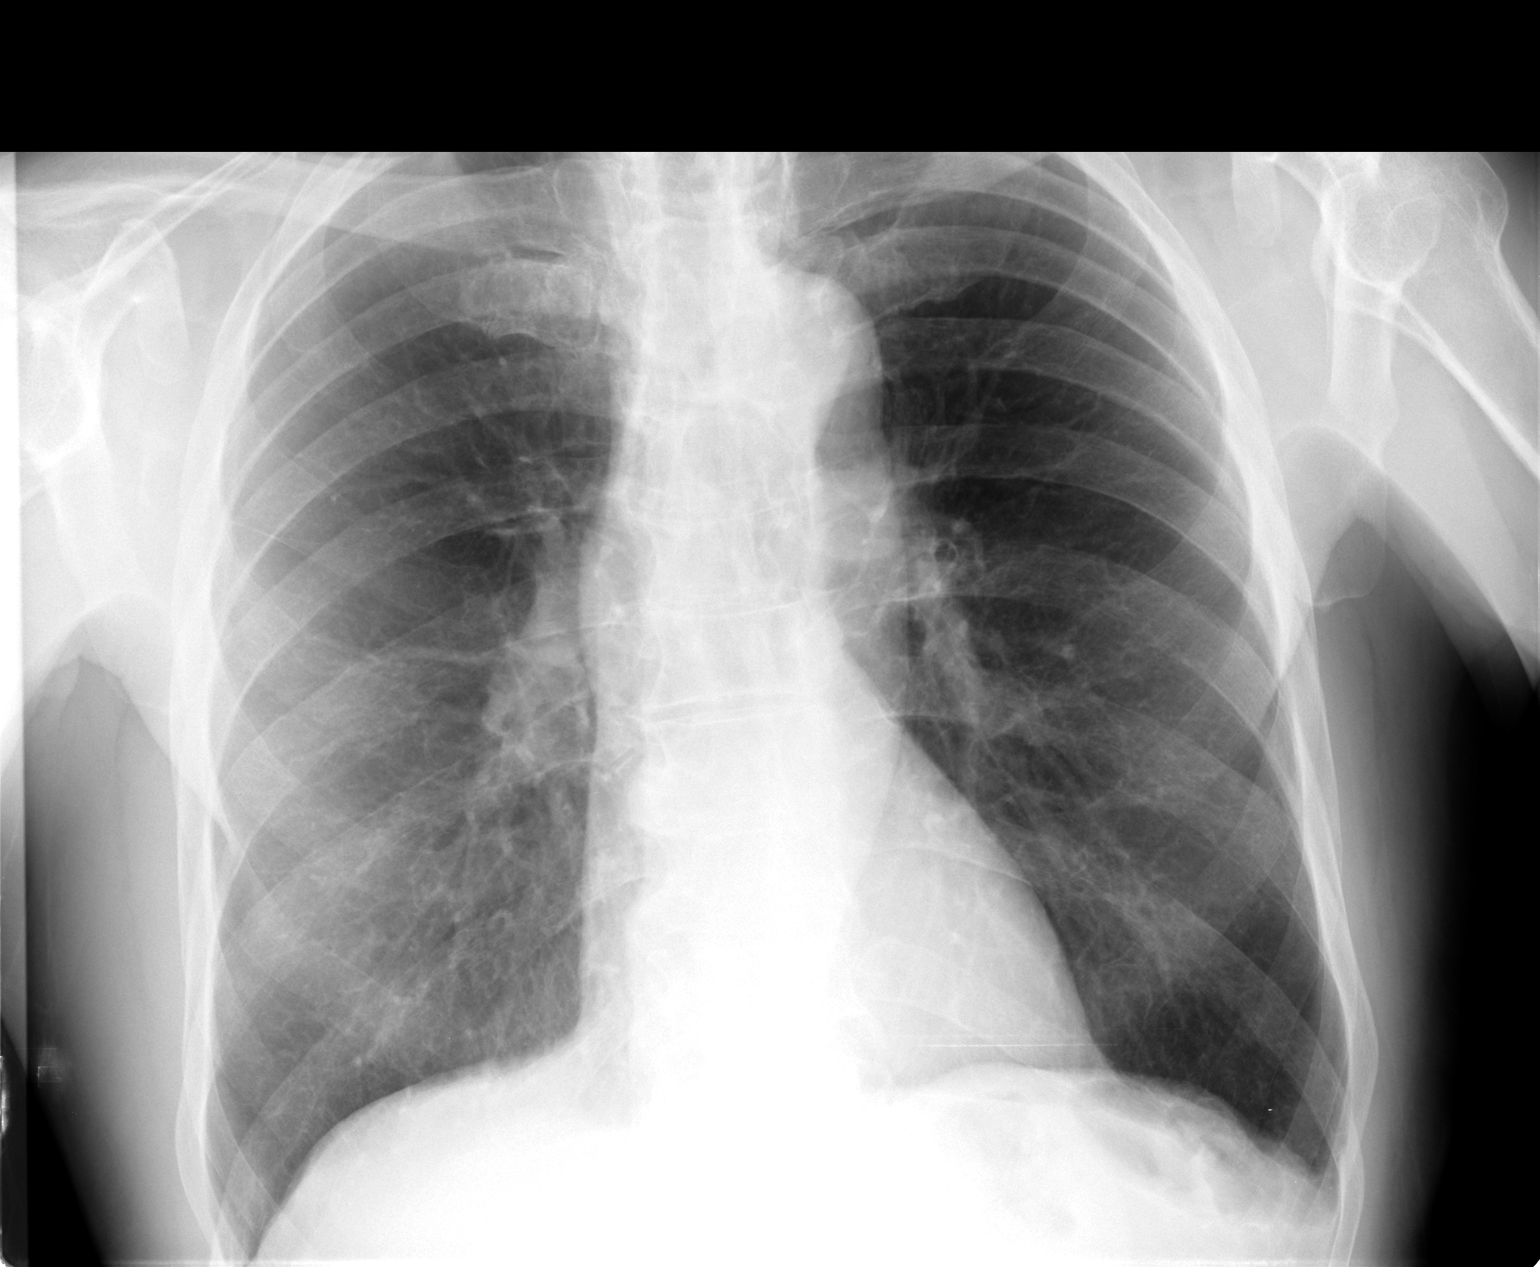

[view not recorded (2 of 2)]
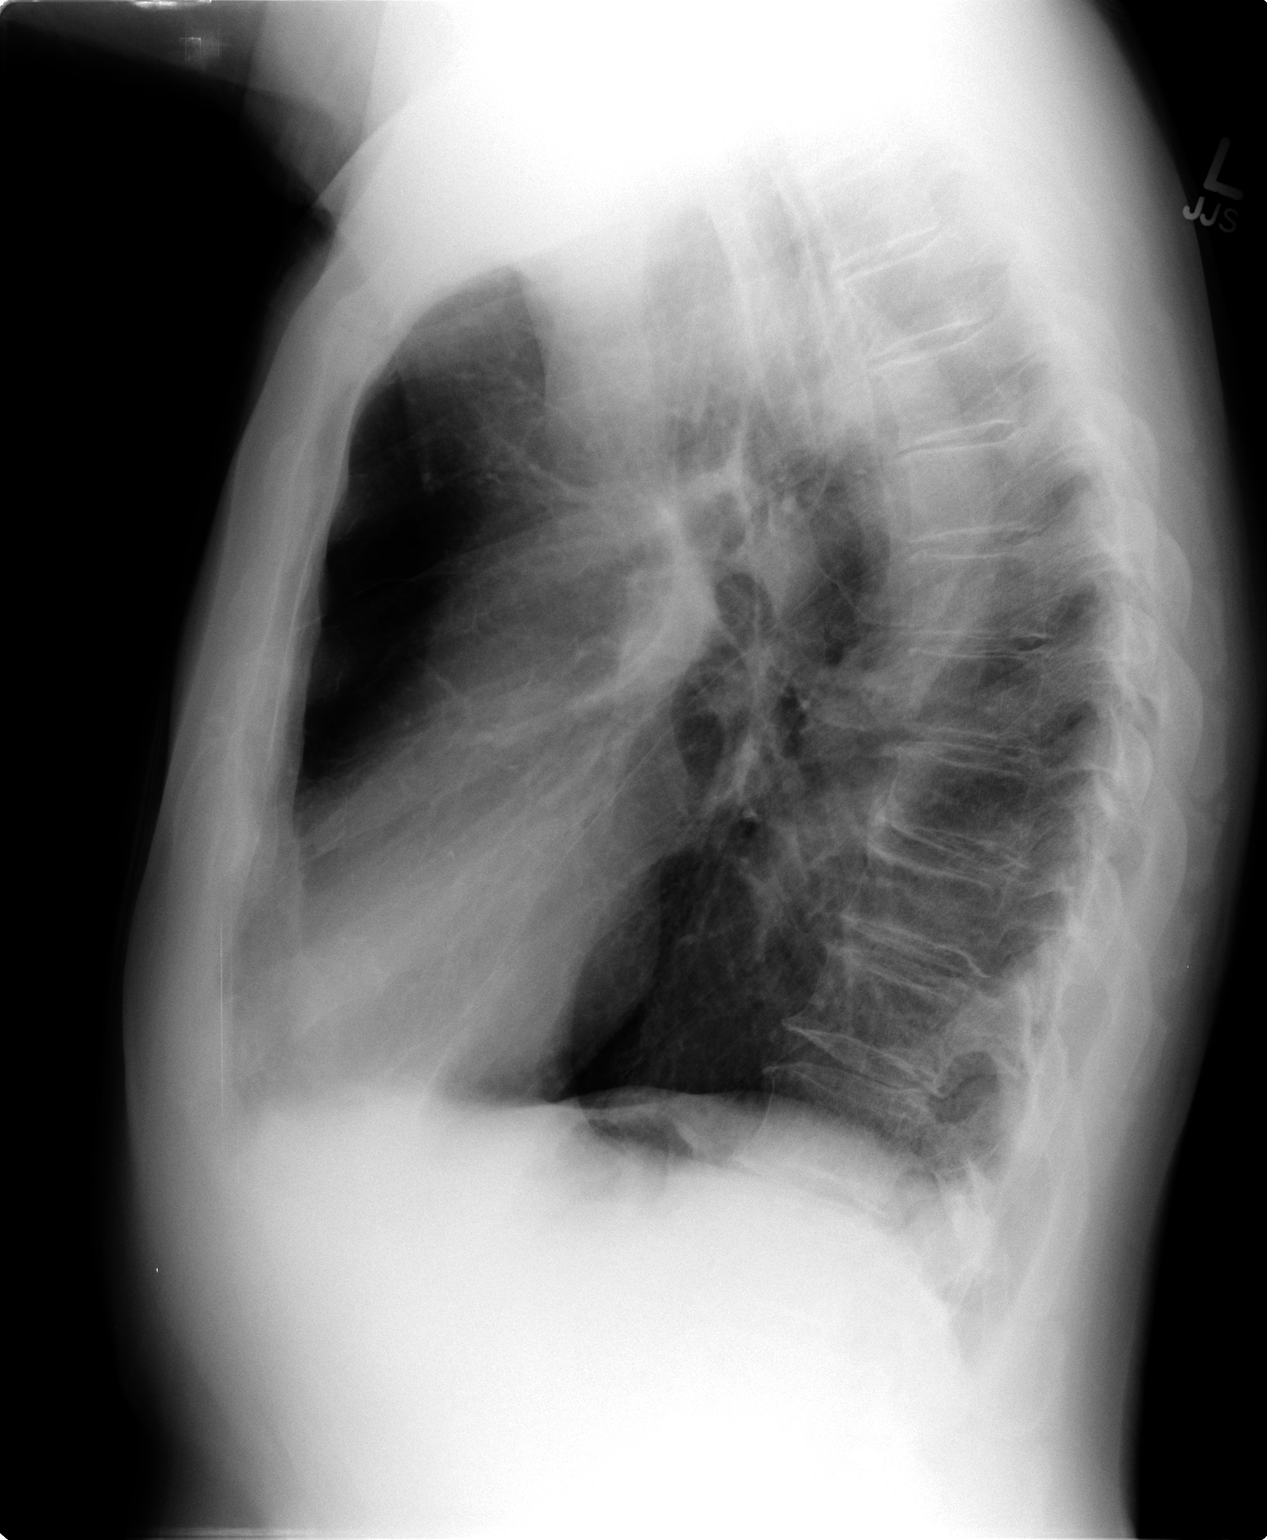

[2 of 2 positions shown; findings below may reference images not displayed]

FINDINGS: Chest is hyperexpanded but the lungs are clear.  Heart
size is normal.  No pleural effusion.
IMPRESSION: Findings compatible with COPD.  No acute disease.

## 2011-05-20 ENCOUNTER — Ambulatory Visit (INDEPENDENT_AMBULATORY_CARE_PROVIDER_SITE_OTHER): Payer: Medicare Other | Admitting: Pulmonary Disease

## 2011-05-20 ENCOUNTER — Encounter: Payer: Self-pay | Admitting: Pulmonary Disease

## 2011-05-20 ENCOUNTER — Other Ambulatory Visit (INDEPENDENT_AMBULATORY_CARE_PROVIDER_SITE_OTHER): Payer: Medicare Other

## 2011-05-20 VITALS — BP 150/82 | HR 48 | Temp 96.8°F | Ht 72.0 in | Wt 182.4 lb

## 2011-05-20 DIAGNOSIS — K219 Gastro-esophageal reflux disease without esophagitis: Secondary | ICD-10-CM

## 2011-05-20 DIAGNOSIS — E785 Hyperlipidemia, unspecified: Secondary | ICD-10-CM

## 2011-05-20 DIAGNOSIS — F419 Anxiety disorder, unspecified: Secondary | ICD-10-CM

## 2011-05-20 DIAGNOSIS — C61 Malignant neoplasm of prostate: Secondary | ICD-10-CM

## 2011-05-20 DIAGNOSIS — K5730 Diverticulosis of large intestine without perforation or abscess without bleeding: Secondary | ICD-10-CM

## 2011-05-20 DIAGNOSIS — Z23 Encounter for immunization: Secondary | ICD-10-CM

## 2011-05-20 DIAGNOSIS — M1990 Unspecified osteoarthritis, unspecified site: Secondary | ICD-10-CM

## 2011-05-20 DIAGNOSIS — J449 Chronic obstructive pulmonary disease, unspecified: Secondary | ICD-10-CM

## 2011-05-20 DIAGNOSIS — F411 Generalized anxiety disorder: Secondary | ICD-10-CM

## 2011-05-20 LAB — BASIC METABOLIC PANEL
BUN: 12 mg/dL (ref 6–23)
CO2: 30 mEq/L (ref 19–32)
Glucose, Bld: 82 mg/dL (ref 70–99)
Potassium: 4.7 mEq/L (ref 3.5–5.1)
Sodium: 139 mEq/L (ref 135–145)

## 2011-05-20 LAB — CBC WITH DIFFERENTIAL/PLATELET
Basophils Absolute: 0 10*3/uL (ref 0.0–0.1)
Eosinophils Relative: 2.7 % (ref 0.0–5.0)
Lymphocytes Relative: 40.8 % (ref 12.0–46.0)
Monocytes Relative: 8.7 % (ref 3.0–12.0)
Neutrophils Relative %: 47.5 % (ref 43.0–77.0)
Platelets: 180 10*3/uL (ref 150.0–400.0)
RDW: 15.1 % — ABNORMAL HIGH (ref 11.5–14.6)
WBC: 6.1 10*3/uL (ref 4.5–10.5)

## 2011-05-20 LAB — LIPID PANEL: Triglycerides: 44 mg/dL (ref 0.0–149.0)

## 2011-05-20 LAB — TSH: TSH: 1.03 u[IU]/mL (ref 0.35–5.50)

## 2011-05-20 LAB — HEPATIC FUNCTION PANEL
AST: 26 U/L (ref 0–37)
Albumin: 3.9 g/dL (ref 3.5–5.2)
Alkaline Phosphatase: 44 U/L (ref 39–117)
Total Protein: 7 g/dL (ref 6.0–8.3)

## 2011-05-20 LAB — LDL CHOLESTEROL, DIRECT: Direct LDL: 125.9 mg/dL

## 2011-05-20 NOTE — Progress Notes (Signed)
Subjective:     Patient ID: Bradley Meza, male   DOB: February 06, 1924, 78 y.o.   MRN: [id number]  HPI 78 y/o BM here for a yearly ROV... he has mult medical problems as noted below>  ~  May 18, 2009:  Issaac continues to feel well- no new complaints or concerns... he has ?prostate cancer on bx by DrWrenn 9/09 w/ PSA= 6.52... he saw DrWrenn back in the office 4/10 w/ neg exam & PSA= 3.67- he rec continued observation & f/u 24yr... he saw DrPaul w/ left shoulder pain 9/10- MRI w/ sm rotator cuff tear & rec conservative approach w/ anti-inflamm/ PT, etc... he requests refill for MMW today, & due for f/u CXR/ labs...  ~  May 18, 2010:  [name] is doing very well at 69!  no new complaints or concerns... he saw DrWrenn 12/11 & PSA was 2.98 (also followed for kidney stone, bladder stone, microheme, testic atrophy)... he denies CP, palpit, SOB, cough, phlegm, etc... FLP looks good w/ very hi HDL.[name]. he maintains an active lifestyle & is quite remarkable...  ~  May 20, 2011:  Yearly ROV & Michaeal reports a good yr> he had bilat cat surg right in Dec & left in Jan, doing well now...  Amazing for 78 y/o> he is only taking ASA 81mg /d, MVI, VitD supplement...  Due for Fasting labs today & he requests flu shot/ TDAP today... He saw Urology 6/12 for f/u Prostate Ca, right renal stone & bladder stone, testic atrophy, renal cyst> PSA is stable at 2.73, doing well w/o voiding complaints, f/u 32yr... LABS 2/13:  FLP- not at goal w/ [id number] LDL126;  Chems- wnl;  CBC= ok;  TSH=1.03;  VitD=33;  PSA=3.71   PROBLEM LIST:    S/P BILAT CATARACT SURG  ALLERGY (ICD-995.3) - uses OTC meds as needed.  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  COPD (ICD-496) - ex-smoker, no signif symptoms... denies cough, sputum, hemoptysis, worsening dyspnea,  wheezing, chest pains, snoring, daytime hypersomnolence, etc... he exercises regularly playing golf, yard work, etc... ~  CXR 2/11 showed clear  lungs, some hyperinflation, normal heart, NAD...  HYPERCHOLESTEROLEMIA, BORDERLINE (ICD-272.4) - on diet alone & he has a high HDL... ~  FLP 1/08 showed TChol 226, TG 42, HDL 89, LDL 110... rec- diet. ~  FLP 1/09 showed TChol 228, TG 45, HDL 82, LDL 109... rec- continue same, needs better diet. ~  FLP 1/10 showed TChol 211, TG 33, HDL 91, LDL 99 ~  FLP 2/11 showed TChol 220, TG 55, HDL 86, LDL 119... rec> better diet vs low dose statin. ~  FLP 2/12 showed TChol 206, TG 34, HDL 93, LDL 98 ~  FLP 2/13 showed TChol 232, TG 44, HDL 84, LDL 126  GERD (ICD-530.81) - he had an EGD in 1986 showing 4-5cm HH, reflux esophagitis, and antritis... he denies abd pain, nausea, vomiting, gas, bloating, change in bowel habits, etc... he uses Zantac/ Pepcid as needed...  DIVERTICULOSIS OF COLON (ICD-562.10) - last colonoscopy 3/02  showed only divertics... stool cards completed Feb09 were neg x6...  PROSTATE CANCER (ICD-185) & ELEVATED PROSTATE SPECIFIC ANTIGEN (ICD-790.93) - he is asymtpomatic, denies LTOS, etc... followed by DrWrenn for Urology w/ Dx= Prostate ca, Elevated PSA, Testic atrophy, Organic impotence... 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 they chose OBSERVATION. ~  f/u Urology 4/10 showed neg exam and PSA= 3.67... he rec f/u 47yr. ~  labs here 2/11 showed PSA= 4.72 > copy to DrWrenn ~  labs from DrWrenn 12/11 showed PSA= 2.98 ~  Labs 6/12 by DrWrenn w/ PSA= 2.73 & he is doing well w/o voiding problems...  RENAL CYST/ Stone in right kidney/ Stone in bladder >> followed by DrWrenn & stable...  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 prob w/ R Shoulder reported and pt saw DrPaul w/ shot given (improved)...  then developed left shoulder pain- eval by DrPaul 9/10 w/ MRI showing sm rotator cuff tear according to pt- Rx w/ anti-inflamm & PT...  BACK PAIN, LUMBAR (ICD-724.2)  BLOOD DONOR - he is B+ and donates ~ Q47mo...  HEALTH MAINTENANCE:  he thinks he had Pneumovax and Tetanus shots at the health dept in early 2000's... he gets the season Flu vaccines each fall... he takes ASA, MVI, Vit D supplements...   Past Surgical History  Procedure Date  . Rih repair     Outpatient Encounter Prescriptions as of 05/20/2011  Medication Sig Dispense Refill  . aspirin 81 MG tablet Take 81 mg by mouth daily.      . cholecalciferol (VITAMIN D) 1000 UNITS tablet Take 1,000 Units by mouth daily.      . Multiple Vitamins-Minerals (CENTRUM SILVER PO) Take 1 tablet by mouth daily.        No Known Allergies   Current Medications, Allergies, Past Medical History, Past Surgical History, Family History, and Social History were reviewed in [employer] record.    Review of Systems         See HPI - all other systems neg except as noted...   The patient complains of decreased hearing.  The patient denies anorexia, fever, weight loss, weight gain, vision loss, hoarseness, chest pain, syncope, dyspnea on exertion, peripheral edema, prolonged cough, headaches, hemoptysis, abdominal pain, melena, hematochezia, severe indigestion/heartburn, hematuria, incontinence, muscle weakness, suspicious skin lesions, transient blindness, difficulty walking, depression, unusual weight change, abnormal bleeding, enlarged lymph nodes, and angioedema.    Objective:   Physical Exam     WD, WN, 78 y/o BM in NAD... GENERAL:  Alert & oriented; pleasant & cooperative... HEENT:  [place]/AT, EOM-wnl, PERRLA, EACs-clear, TMs-wnl, NOSE-clear, THROAT-clear & wnl. NECK:  Supple w/ fairROM; no JVD; normal carotid impulses w/o bruits; no thyromegaly or nodules palpated; no lymphadenopathy. CHEST:  Clear to P & A; without  wheezes/ rales/ or rhonchi. HEART:  Regular Rhythm; without murmurs/ rubs/ or gallops. ABDOMEN:  Soft & nontender; normal bowel sounds; no organomegaly or masses detected. EXT: without deformities, mild arthritic changes; no varicose veins/ venous insuffic/ or edema. NEURO:  CN's intact; no focal neuro deficits... DERM:  No lesions noted; no rash etc...  RADIOLOGY DATA:  Reviewed in the EPIC EMR & discussed w/ the patient...    >>CXR 2/11 showed clear lungs, some hyperinflation, normal heart, NAD...  LABORATORY DATA:  Reviewed in the EPIC EMR & discussed w/ the patient...    >>LABS 2/13:  FLP- not at goal w/ [id number] LDL126;  Chems- wnl;  CBC= ok;  TSH=1.03;  VitD=33;  PSA=3.71   Assessment:     COPD>  He continues to do well, not requiring meds, gets regular exercise, no exac.Marland KitchenMarland Kitchen  Chol>  FLP not at goal on diet alone but excellent HDL & he declines low dose statin Rx...  GERD>  Known HH w/ hx esoph in past but currently remains asymptomatic & hasn't needed OTC PPI Rx...  Divertics>  Last colon in 2002 w/ divertics only, he is 78 y/o now & asymptomatic...  GU>  Followed by DrWrenn; hx sm focus of prostate ca in prev bx but PSA has been WNL & they continue observation...  DJD>  He has seen DrPaul in the past; he continues to do well w/ exercise & OTC analgesics as needed...  Blood donor>  He is B+ and continues to donate every 29mo...    Plan:     OK for Flu shot & TDAP today...  Patient's Medications  New Prescriptions   No medications on file  Previous Medications   ASPIRIN 81 MG TABLET    Take 81 mg by mouth daily.   CHOLECALCIFEROL (VITAMIN D) 1000 UNITS TABLET    Take 1,000 Units by mouth daily.   MULTIPLE VITAMINS-MINERALS (CENTRUM SILVER PO)    Take 1 tablet by mouth daily.  Modified Medications   No medications on file  Discontinued Medications   No medications on file

## 2011-05-20 NOTE — Patient Instructions (Signed)
Today we updated your med list in our EPIC system...    Continue your current medications the same...  Today we did your follow up fasting blood work...    Please call the PHONE TREE in a few days for your results...    Dial [id number] & when prompted enter your patient number followed by the # symbol...    Your patient number is:  [id number]#  Today we gave you the 2012 Flu vaccine & the combined tetanus vaccine called the TDAP (good for 80yrs)...  Keep up the good work w/ diet & exercise...  Call for any problems...  Let's plan a follow up visit in 1 yr, sooner if needed for any reason.Marland KitchenMarland Kitchen

## 2011-05-21 LAB — VITAMIN D 25 HYDROXY (VIT D DEFICIENCY, FRACTURES): Vit D, 25-Hydroxy: 33 ng/mL (ref 30–89)

## 2011-05-28 ENCOUNTER — Other Ambulatory Visit: Payer: Medicare Other

## 2011-05-28 DIAGNOSIS — Z1289 Encounter for screening for malignant neoplasm of other sites: Secondary | ICD-10-CM

## 2011-05-28 LAB — HEMOCCULT SLIDES (X 3 CARDS)
Fecal Occult Blood: NEGATIVE
OCCULT 3: NEGATIVE
OCCULT 4: NEGATIVE
OCCULT 5: NEGATIVE

## 2011-05-29 ENCOUNTER — Telehealth: Payer: Self-pay | Admitting: Pulmonary Disease

## 2011-05-29 NOTE — Telephone Encounter (Signed)
Bradley Meza was attempting to call patient regarding Hemoccult slides-all were negative. I had to leave a message for patient to call us back for results. Results are in Leigh's holding box.

## 2011-05-30 NOTE — Telephone Encounter (Signed)
Returning call.  [phone]

## 2011-05-30 NOTE — Telephone Encounter (Signed)
Pt informed of hemoccult slide results per Dr Kriste Basque.

## 2012-05-19 ENCOUNTER — Encounter: Payer: Self-pay | Admitting: Pulmonary Disease

## 2012-05-19 ENCOUNTER — Other Ambulatory Visit (INDEPENDENT_AMBULATORY_CARE_PROVIDER_SITE_OTHER): Payer: Medicare Other

## 2012-05-19 ENCOUNTER — Ambulatory Visit (INDEPENDENT_AMBULATORY_CARE_PROVIDER_SITE_OTHER)
Admission: RE | Admit: 2012-05-19 | Discharge: 2012-05-19 | Disposition: A | Discharge status: Home / Self Care | Payer: Medicare Other | Source: Ambulatory Visit | Attending: Pulmonary Disease | Admitting: Pulmonary Disease

## 2012-05-19 ENCOUNTER — Ambulatory Visit (INDEPENDENT_AMBULATORY_CARE_PROVIDER_SITE_OTHER): Payer: Medicare Other | Admitting: Pulmonary Disease

## 2012-05-19 VITALS — BP 138/78 | HR 56 | Temp 97.5°F | Ht 72.0 in | Wt 176.6 lb

## 2012-05-19 DIAGNOSIS — J449 Chronic obstructive pulmonary disease, unspecified: Secondary | ICD-10-CM

## 2012-05-19 DIAGNOSIS — E785 Hyperlipidemia, unspecified: Secondary | ICD-10-CM

## 2012-05-19 DIAGNOSIS — C61 Malignant neoplasm of prostate: Secondary | ICD-10-CM

## 2012-05-19 DIAGNOSIS — K5730 Diverticulosis of large intestine without perforation or abscess without bleeding: Secondary | ICD-10-CM

## 2012-05-19 DIAGNOSIS — M545 Low back pain, unspecified: Secondary | ICD-10-CM

## 2012-05-19 DIAGNOSIS — M1990 Unspecified osteoarthritis, unspecified site: Secondary | ICD-10-CM

## 2012-05-19 DIAGNOSIS — F411 Generalized anxiety disorder: Secondary | ICD-10-CM

## 2012-05-19 DIAGNOSIS — K219 Gastro-esophageal reflux disease without esophagitis: Secondary | ICD-10-CM

## 2012-05-19 DIAGNOSIS — N281 Cyst of kidney, acquired: Secondary | ICD-10-CM

## 2012-05-19 LAB — LIPID PANEL
Cholesterol: 213 mg/dL — ABNORMAL HIGH (ref 0–200)
Total CHOL/HDL Ratio: 3
Triglycerides: 43 mg/dL (ref 0.0–149.0)

## 2012-05-19 LAB — HEPATIC FUNCTION PANEL
AST: 33 U/L (ref 0–37)
Bilirubin, Direct: 0.1 mg/dL (ref 0.0–0.3)
Total Bilirubin: 0.6 mg/dL (ref 0.3–1.2)

## 2012-05-19 LAB — BASIC METABOLIC PANEL
BUN: 20 mg/dL (ref 6–23)
CO2: 30 mEq/L (ref 19–32)
Calcium: 9.6 mg/dL (ref 8.4–10.5)
Chloride: 104 mEq/L (ref 96–112)
Creatinine, Ser: 1.4 mg/dL (ref 0.4–1.5)
Glucose, Bld: 73 mg/dL (ref 70–99)

## 2012-05-19 LAB — CBC WITH DIFFERENTIAL/PLATELET
Basophils Relative: 0.5 % (ref 0.0–3.0)
Eosinophils Relative: 2.2 % (ref 0.0–5.0)
HCT: 39.2 % (ref 39.0–52.0)
Lymphs Abs: 3.1 10*3/uL (ref 0.7–4.0)
MCHC: 33.1 g/dL (ref 30.0–36.0)
MCV: 88.2 fl (ref 78.0–100.0)
Monocytes Absolute: 0.5 10*3/uL (ref 0.1–1.0)
Platelets: 173 10*3/uL (ref 150.0–400.0)
WBC: 6.5 10*3/uL (ref 4.5–10.5)

## 2012-05-19 LAB — TSH: TSH: 1.08 u[IU]/mL (ref 0.35–5.50)

## 2012-05-19 NOTE — Patient Instructions (Addendum)
Today we updated your med list in our EPIC system...    Continue your current medications the same...  Today we dis your follow up CXR & FASTING blood work...    We will contact you w/ the results when avail...  Call for any problems or if we can be of service in any way...  Let's continue our yearly check ups but call any time if needed for problems.Marland KitchenMarland Kitchen

## 2012-05-19 NOTE — Progress Notes (Signed)
Subjective:     Patient ID: Bradley Meza, male   DOB: April 18, 1923, 79 y.o.   MRN: [id number]  HPI 79 y/o BM here for a yearly ROV... he has mult medical problems as noted below>  ~  May 18, 2009:  Bradley Meza continues to feel well- no new complaints or concerns... he has ?prostate cancer on bx by DrWrenn 9/09 w/ PSA= 6.52... he saw DrWrenn back in the office 4/10 w/ neg exam & PSA= 3.67- he rec continued observation & f/u 84yr... he saw DrPaul w/ left shoulder pain 9/10- MRI w/ sm rotator cuff tear & rec conservative approach w/ anti-inflamm/ PT, etc... he requests refill for MMW today, & due for f/u CXR/ labs...  ~  May 18, 2010:  Bradley Meza is doing very well at 14!  no new complaints or concerns... he saw DrWrenn 12/11 & PSA was 2.98 (also followed for kidney stone, bladder stone, microheme, testic atrophy)... he denies CP, palpit, SOB, cough, phlegm, etc... FLP looks good w/ very hi HDL.[name]. he maintains an active lifestyle & is quite remarkable...  ~  May 20, 2011:  Yearly ROV & Bradley Meza reports a good yr> he had bilat cat surg right in Dec & left in Jan, doing well now...  Amazing for 79 y/o> he is only taking ASA 81mg /d, MVI, VitD supplement...  Due for Fasting labs today & he requests flu shot/ TDAP today... He saw Urology 6/12 for f/u Prostate Ca, right renal stone & bladder stone, testic atrophy, renal cyst> PSA is stable at 2.73, doing well w/o voiding complaints, f/u 88yr... LABS 2/13:  FLP- not at goal w/ [id number];  Chems- wnl;  CBC= ok;  TSH=1.03;  VitD=33;  PSA=3.71   ~  May 19, 2012:  Yearly ROV & Bradley Meza has had another good year, no new complaints or concerns; We reviewed the following medical problems during today's office visit >>     COPD> on ASA81; he is an ex-smoker; doing well w/o breathing problems- no cough, phlegm, blood, SOB, CP, etc; he still mows, shovels, etc...    Chol> on diet alone; FLP shows TChol 213, TG 43, HDL 82, LDL 105; his HDL has been very good...  GI- GERD, Divertics> known 4-5cmHH on remote EGD but he denies abd pain, dysphagia, n/v, c/d, blood etc; last colon 2002 w/ divertics only, stool cards 2009 neg x6...    Prostate Cancer> followed by DrWrenn- Bx pos in 2009 w/ PSA=6.52 but it has been lower ever since on observ alone...    DJD, LBP> on MVI, VitD1000; he has seen DrPaul in the past- just OTC pain meds & occas shot & PT; no complaints at present... We reviewed prob list, meds, xrays and labs> see below for updates >> he had the 2013 Flu vaccine 1/14... CXR 2/14 showed normal heart size, uncoiled Ao, sl hyperinflation & minor scarring, otherw clear/ NAD... LABS 2/14:  FLP- not quite at goals on diet alone;  Chems- wnl;  CBC- wnl;  TSH=1.08           PROBLEM LIST:    S/P BILAT CATARACT SURG  ALLERGY (ICD-995.3) - uses OTC meds as needed.  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  COPD (ICD-496) - ex-smoker, no signif symptoms... denies cough, sputum, hemoptysis, worsening dyspnea,  wheezing, chest pains, snoring, daytime hypersomnolence, etc... he exercises regularly playing golf, yard work, etc... ~  CXR 2/11 showed clear lungs, some hyperinflation, normal heart, NAD.Marland KitchenMarland Kitchen ~  CXR 2/14 showed normal heart size, uncoiled Ao, sl hyperinflation & minor scarring, otherw clear/ NAD.  HYPERCHOLESTEROLEMIA, BORDERLINE (ICD-272.4) - on diet alone & he has a high HDL... ~  FLP 1/08 showed TChol 226, TG 42, HDL 89, LDL 110... rec- diet. ~  FLP 1/09 showed TChol 228, TG 45, HDL 82, LDL 109... rec- continue same, needs better diet. ~  FLP 1/10 showed TChol 211, TG 33, HDL 91, LDL 99 ~  FLP 2/11 showed TChol 220, TG 55, HDL 86, LDL 119... rec> better diet vs low dose statin. ~  FLP 2/12 showed TChol 206, TG 34, HDL 93, LDL 98 ~  FLP 2/13 showed TChol 232, TG 44, HDL 84, LDL 126 ~  FLP 2/14 showed TChol 213, TG 43, HDL 82, LDL 105  GERD (ICD-530.81) - he had an EGD in 1986 showing 4-5cm HH, reflux  esophagitis, and antritis... he denies abd pain, nausea, vomiting, gas, bloating, change in bowel habits, etc... he uses Zantac/ Pepcid as needed...  DIVERTICULOSIS OF COLON (ICD-562.10) - last colonoscopy 3/02  showed only divertics... stool cards completed Feb09 were neg x6...  PROSTATE CANCER (ICD-185) & ELEVATED PROSTATE SPECIFIC ANTIGEN (ICD-790.93) - he is asymtpomatic, denies LTOS, etc... followed by DrWrenn for Urology w/ Dx= Prostate ca, Elevated PSA, Testic atrophy, Organic impotence... 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 they chose OBSERVATION. ~  f/u Urology 4/10 showed neg exam and PSA= 3.67... he rec f/u 7yr. ~  labs here 2/11 showed PSA= 4.72 > copy to DrWrenn ~  labs from DrWrenn 12/11 showed PSA= 2.98 ~  Labs 6/12 by DrWrenn w/ PSA= 2.73 & he is doing well w/o voiding problems... ~  2/14:  followed by DrWrenn- Bx pos in 2009 w/ PSA=6.52 but it has been lower ever since on observ alone.  RENAL CYST/ Stone in right kidney/ Stone in bladder >> followed by DrWrenn & stable...  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 prob w/ R Shoulder reported and pt saw DrPaul w/ shot given (improved)... then developed left shoulder pain- eval by DrPaul 9/10 w/ MRI showing sm rotator cuff tear according to pt- Rx w/ anti-inflamm & PT...  BACK PAIN, LUMBAR (ICD-724.2)  BLOOD DONOR - he is B+ and donates ~ Q58mo...  HEALTH MAINTENANCE:  he thinks he had Pneumovax and Tetanus shots at the health dept in early 2000's... he gets the season Flu vaccines each fall... he takes ASA, MVI, Vit D supplements...   Past Surgical History  Procedure Laterality Date  . Rih repair      Outpatient Encounter  Prescriptions as of 05/19/2012  Medication Sig Dispense Refill  . aspirin 81 MG tablet Take 81 mg by mouth daily.      . cholecalciferol (VITAMIN D) 1000 UNITS tablet Take 1,000 Units by mouth daily.      . Multiple Vitamins-Minerals (CENTRUM SILVER PO) Take 1 tablet by mouth daily.       No facility-administered encounter medications on file as of 05/19/2012.    No Known Allergies   Current Medications, Allergies, Past Medical History, Past Surgical History, Family History, and Social History were reviewed in [employer] record.  Review of Systems         See HPI - all other systems neg except as noted...   The patient complains of decreased hearing.  The patient denies anorexia, fever, weight loss, weight gain, vision loss, hoarseness, chest pain, syncope, dyspnea on exertion, peripheral edema, prolonged cough, headaches, hemoptysis, abdominal pain, melena, hematochezia, severe indigestion/heartburn, hematuria, incontinence, muscle weakness, suspicious skin lesions, transient blindness, difficulty walking, depression, unusual weight change, abnormal bleeding, enlarged lymph nodes, and angioedema.    Objective:   Physical Exam     WD, WN, 79 y/o BM in NAD... GENERAL:  Alert & oriented; pleasant & cooperative... HEENT:  [place]/AT, EOM-wnl, PERRLA, EACs-clear, TMs-wnl, NOSE-clear, THROAT-clear & wnl. NECK:  Supple w/ fairROM; no JVD; normal carotid impulses w/o bruits; no thyromegaly or nodules palpated; no lymphadenopathy. CHEST:  Clear to P & A; without wheezes/ rales/ or rhonchi. HEART:  Regular Rhythm; without murmurs/ rubs/ or gallops. ABDOMEN:  Soft & nontender; normal bowel sounds; no organomegaly or masses detected. EXT: without deformities, mild arthritic changes; no varicose veins/ venous insuffic/ or edema. NEURO:  CN's intact; no focal neuro deficits... DERM:  No lesions noted; no rash etc...  RADIOLOGY DATA:  Reviewed in the EPIC EMR & discussed w/  the patient...    LABORATORY DATA:  Reviewed in the EPIC EMR & discussed w/ the patient...    Assessment:      COPD>  He continues to do well, not requiring meds, gets regular exercise, no exac...  Chol>  FLP not at goal on diet alone but excellent HDL & he declines low dose statin Rx...  GERD>  Known HH w/ hx esoph in past but currently remains asymptomatic & hasn't needed OTC PPI Rx...  Divertics>  Last colon in 2002 w/ divertics only, he is 79 y/o now & asymptomatic...  GU>  Followed by DrWrenn; hx sm focus of prostate ca in prev bx but PSA has been WNL & they continue observation...  DJD>  He has seen DrPaul in the past; he continues to do well w/ exercise & OTC analgesics as needed...  Blood donor>  He is B+ and continues to donate every 73mo...     Plan:     Patient's Medications  New Prescriptions   No medications on file  Previous Medications   ASPIRIN 81 MG TABLET    Take 81 mg by mouth daily.   CHOLECALCIFEROL (VITAMIN D) 1000 UNITS TABLET    Take 1,000 Units by mouth daily.   MULTIPLE VITAMINS-MINERALS (CENTRUM SILVER PO)    Take 1 tablet by mouth daily.  Modified Medications   No medications on file  Discontinued Medications   No medications on file

## 2012-05-20 ENCOUNTER — Telehealth: Payer: Self-pay | Admitting: Pulmonary Disease

## 2012-05-20 NOTE — Progress Notes (Signed)
Quick Note:  LMTCB ______ 

## 2012-05-20 NOTE — Telephone Encounter (Signed)
Notes Recorded by Michele Mcalpine, MD on 05/20/2012 at 8:13 AM Please notify patient>  FLP looks good on diet alone... Chems, CBC, Thyroid> all WNL...  Notes Recorded by Michele Mcalpine, MD on 05/20/2012 at 8:12 AM Please notify patient>  CXR is essentially clear, NAD.Marland KitchenMarland Kitchen -----  I spoke with patient about results and he verbalized understanding and had no questions

## 2013-01-29 ENCOUNTER — Ambulatory Visit (INDEPENDENT_AMBULATORY_CARE_PROVIDER_SITE_OTHER): Payer: Medicare Other

## 2013-01-29 DIAGNOSIS — Z23 Encounter for immunization: Secondary | ICD-10-CM

## 2013-05-19 ENCOUNTER — Ambulatory Visit: Payer: Medicare Other | Admitting: Pulmonary Disease

## 2013-06-17 ENCOUNTER — Telehealth: Payer: Self-pay | Admitting: Pulmonary Disease

## 2013-06-17 NOTE — Telephone Encounter (Signed)
lmom x 1 Per Leigh, patient needs to be explained the change in SN practice--06/30/13 retiring from [facility] Will only practice Pulmonary in office. Pt has upcoming appt 08/09/2013--can keep this but for Pulm reasons ONLY Pt needs to be given location options and # to contact to establish PC

## 2013-06-18 NOTE — Telephone Encounter (Signed)
lmomtcb x1 

## 2013-06-18 NOTE — Telephone Encounter (Signed)
Pt returning call to nurse

## 2013-06-21 NOTE — Telephone Encounter (Signed)
Spoke with the pt and advised of SN's retirement  He verbalized understanding Would like to keep ov with SN in May to fu COPD  I gave the pt the number for [facility] so he can call and arrange ov with new PCP

## 2013-06-21 NOTE — Telephone Encounter (Signed)
[email or web address]. Kaleen Rochette, CMA  

## 2013-06-21 NOTE — Telephone Encounter (Signed)
Pt returning call to [place].Bradley Meza

## 2013-06-25 ENCOUNTER — Telehealth: Payer: Self-pay | Admitting: Pulmonary Disease

## 2013-06-25 NOTE — Telephone Encounter (Signed)
Spoke with patient-it was an old message he was calling us back about. Nothing needed. Pt has [facility] number for new PCP and will keep May 2015 appt with SN for COPD follow up.

## 2013-08-09 ENCOUNTER — Ambulatory Visit: Payer: Medicare Other | Admitting: Pulmonary Disease

## 2013-08-09 ENCOUNTER — Other Ambulatory Visit (INDEPENDENT_AMBULATORY_CARE_PROVIDER_SITE_OTHER): Payer: Medicare Other

## 2013-08-09 ENCOUNTER — Encounter: Payer: Self-pay | Admitting: Pulmonary Disease

## 2013-08-09 ENCOUNTER — Ambulatory Visit (INDEPENDENT_AMBULATORY_CARE_PROVIDER_SITE_OTHER): Payer: Self-pay | Admitting: Pulmonary Disease

## 2013-08-09 VITALS — BP 130/88 | HR 58 | Temp 97.9°F | Ht 73.0 in | Wt 180.0 lb

## 2013-08-09 DIAGNOSIS — N200 Calculus of kidney: Secondary | ICD-10-CM | POA: Insufficient documentation

## 2013-08-09 DIAGNOSIS — I059 Rheumatic mitral valve disease, unspecified: Secondary | ICD-10-CM

## 2013-08-09 DIAGNOSIS — F419 Anxiety disorder, unspecified: Secondary | ICD-10-CM

## 2013-08-09 DIAGNOSIS — R0609 Other forms of dyspnea: Secondary | ICD-10-CM

## 2013-08-09 DIAGNOSIS — I340 Nonrheumatic mitral (valve) insufficiency: Secondary | ICD-10-CM | POA: Insufficient documentation

## 2013-08-09 DIAGNOSIS — E785 Hyperlipidemia, unspecified: Secondary | ICD-10-CM

## 2013-08-09 DIAGNOSIS — F411 Generalized anxiety disorder: Secondary | ICD-10-CM

## 2013-08-09 DIAGNOSIS — R0989 Other specified symptoms and signs involving the circulatory and respiratory systems: Secondary | ICD-10-CM

## 2013-08-09 DIAGNOSIS — K5730 Diverticulosis of large intestine without perforation or abscess without bleeding: Secondary | ICD-10-CM

## 2013-08-09 DIAGNOSIS — R0600 Dyspnea, unspecified: Secondary | ICD-10-CM

## 2013-08-09 DIAGNOSIS — J449 Chronic obstructive pulmonary disease, unspecified: Secondary | ICD-10-CM

## 2013-08-09 DIAGNOSIS — K219 Gastro-esophageal reflux disease without esophagitis: Secondary | ICD-10-CM

## 2013-08-09 DIAGNOSIS — N281 Cyst of kidney, acquired: Secondary | ICD-10-CM

## 2013-08-09 DIAGNOSIS — C61 Malignant neoplasm of prostate: Secondary | ICD-10-CM

## 2013-08-09 LAB — BASIC METABOLIC PANEL
BUN: 13 mg/dL (ref 6–23)
CHLORIDE: 104 meq/L (ref 96–112)
CO2: 30 mEq/L (ref 19–32)
Calcium: 9.1 mg/dL (ref 8.4–10.5)
Creatinine, Ser: 1.1 mg/dL (ref 0.4–1.5)
GFR: 81.2 mL/min (ref >60.00)
Glucose, Bld: 80 mg/dL (ref 70–99)
POTASSIUM: 4.6 meq/L (ref 3.5–5.1)
Sodium: 138 mEq/L (ref 135–145)

## 2013-08-09 LAB — CBC WITH DIFFERENTIAL/PLATELET
BASOS ABS: 0 10*3/uL (ref 0.0–0.1)
Basophils Relative: 0.5 % (ref 0.0–3.0)
Eosinophils Absolute: 0.1 10*3/uL (ref 0.0–0.7)
Eosinophils Relative: 1.2 % (ref 0.0–5.0)
HCT: 39.5 % (ref 39.0–52.0)
Hemoglobin: 13.1 g/dL (ref 13.0–17.0)
Lymphocytes Relative: 52.4 % — ABNORMAL HIGH (ref 12.0–46.0)
Lymphs Abs: 4.3 10*3/uL — ABNORMAL HIGH (ref 0.7–4.0)
MCHC: 33.1 g/dL (ref 30.0–36.0)
MCV: 89.4 fl (ref 78.0–100.0)
MONO ABS: 0.5 10*3/uL (ref 0.1–1.0)
Monocytes Relative: 5.8 % (ref 3.0–12.0)
NEUTROS PCT: 40.1 % — AB (ref 43.0–77.0)
Neutro Abs: 3.3 10*3/uL (ref 1.4–7.7)
PLATELETS: 164 10*3/uL (ref 150.0–400.0)
RBC: 4.42 Mil/uL (ref 4.22–5.81)
RDW: 14.9 % (ref 11.5–15.5)
WBC: 8.3 10*3/uL (ref 4.0–10.5)

## 2013-08-09 LAB — LIPID PANEL
CHOLESTEROL: 207 mg/dL — AB (ref 0–200)
HDL: 92.6 mg/dL (ref >39.00)
LDL Cholesterol: 107 mg/dL — ABNORMAL HIGH (ref 0–99)
Total CHOL/HDL Ratio: 2
Triglycerides: 37 mg/dL (ref 0.0–149.0)
VLDL: 7.4 mg/dL (ref 0.0–40.0)

## 2013-08-09 LAB — HEPATIC FUNCTION PANEL
ALT: 15 U/L (ref 0–53)
AST: 24 U/L (ref 0–37)
Albumin: 3.9 g/dL (ref 3.5–5.2)
Alkaline Phosphatase: 43 U/L (ref 39–117)
BILIRUBIN TOTAL: 1 mg/dL (ref 0.2–1.2)
Bilirubin, Direct: 0.1 mg/dL (ref 0.0–0.3)
Total Protein: 7.3 g/dL (ref 6.0–8.3)

## 2013-08-09 LAB — TSH: TSH: 0.9 u[IU]/mL (ref 0.35–4.50)

## 2013-08-09 LAB — BRAIN NATRIURETIC PEPTIDE: Pro B Natriuretic peptide (BNP): 135 pg/mL — ABNORMAL HIGH (ref 0.0–100.0)

## 2013-08-09 NOTE — Progress Notes (Signed)
Subjective:     Patient ID: Bradley Meza, male   DOB: [birth year], 80 y.o.   MRN: [id number]  HPI 80 y/o BM here for a yearly ROV... he has mult medical problems as noted below>  ~  May 18, 2009:  Bradley Meza continues to feel well- no new complaints or concerns... he has ?prostate cancer on bx by DrWrenn 9/09 w/ PSA= 6.52... he saw DrWrenn back in the office 4/10 w/ neg exam & PSA= 3.67- he rec continued observation & f/u 75yr... he saw DrPaul w/ left shoulder pain 9/10- MRI w/ sm rotator cuff tear & rec conservative approach w/ anti-inflamm/ PT, etc... he requests refill for MMW today, & due for f/u CXR/ labs...  ~  May 18, 2010:  Bradley Meza is doing very well at 45!  no new complaints or concerns... he saw DrWrenn 12/11 & PSA was 2.98 (also followed for kidney stone, bladder stone, microheme, testic atrophy)... he denies CP, palpit, SOB, cough, phlegm, etc... FLP looks good w/ very hi HDL.[name]. he maintains an active lifestyle & is quite remarkable...  ~  May 20, 2011:  Yearly [place] reports a good yr> he had bilat cat surg right in Dec & left in Jan, doing well now...  Amazing for 80 y/o> he is only taking ASA 81mg /d, MVI, VitD supplement...  Due for Fasting labs today & he requests flu shot/ TDAP today... He saw Urology 6/12 for f/u Prostate Ca, right renal stone & bladder stone, testic atrophy, renal cyst> PSA is stable at 2.73, doing well w/o voiding complaints, f/u 13yr...  LABS 2/13:  FLP- not at goal w/ [id number];  Chems- wnl;  CBC= ok;  TSH=1.03;  VitD=33;  PSA=3.71   ~  May 19, 2012:  Yearly ROV & Bradley Meza has had another good year, no new complaints or concerns; We reviewed the following medical problems during today's office visit >>     COPD> on ASA81; he is an ex-smoker; doing well w/o breathing problems- no cough, phlegm, blood, SOB, CP, etc; he still mows, shovels, etc...    Chol> on diet alone; FLP shows TChol 213, TG 43, HDL 82, LDL 105; his HDL has been very good...     GI- GERD, Divertics> known 4-5cmHH on remote EGD but he denies abd pain, dysphagia, n/v, c/d, blood etc; last colon 2002 w/ divertics only, stool cards 2009 neg x6...    Prostate Cancer> followed by DrWrenn- Bx pos in 2009 w/ PSA=6.52 but it has been lower ever since on observ alone...    DJD, LBP> on MVI, VitD1000; he has seen DrPaul in the past- just OTC pain meds & occas shot & PT; no complaints at present... We reviewed prob list, meds, xrays and labs> see below for updates >> he had the 2013 Flu vaccine 1/14...  CXR 2/14 showed normal heart size, uncoiled Ao, sl hyperinflation & minor scarring, otherw clear/ NAD...  LABS 2/14:  FLP- not quite at goals on diet alone;  Chems- wnl;  CBC- wnl;  TSH=1.08   ~  Aug 09, 2013:  22mo Bradley Meza contiues to enjoy excellent general medical health now at 80 y/o!  No new complaints or concerns, he remains active in yard, etc and is not taking any prescription meds... We reviewed the following medical problems during today's office visit >>     COPD> on ASA81; he is an ex-smoker; doing well w/o current breathing problems- no cough, phlegm, blood, SOB, CP, etc; he still mows,  shovels, etc...    Heart murmur> he has a Gr2/6 MR murmur on exam; he remains asymptomatic w/o CP, palpit, SOB, etc; EKG shows SBrady, early repol, otherw neg; 2DEcho=> pending...    Chol> on diet alone; FLP 5/15 shows TChol 207, TG 37, HDL 93, LDL 107; his HDL has been very good indeed...    GI- GERD, Divertics> known 4-5cmHH on remote EGD but he denies abd pain, dysphagia, n/v, c/d, blood etc; last colon 2002 w/ divertics only, stool cards 2009 neg x6...    Prostate Cancer> followed by DrWrenn- Bx pos in 2009 w/ PSA=6.52 but it has been lower ever since on observ alone (see below)...    DJD, LBP> on MVI, VitD1000; he has seen DrPaul in the past- just OTC pain meds & occas shot & PT; no complaints at present... We reviewed prob list, meds, xrays and labs> see below for updates >>    EKG 5/15 showed SBrady, rate55, early repol & NAD...  2DEcho 5/15 showed => pending  LABS 5/15:  FLP- ok on diet alone w/ HDL=93;  Chems- wnl;  CBC- wnl;  TSH=0.90;  PSA followed by DrWrenn (3.65 Aug2014)....          PROBLEM LIST:    S/P BILAT CATARACT SURG  ALLERGY (ICD-995.3) - uses OTC meds as needed.  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  COPD (ICD-496) - ex-smoker, no signif symptoms... denies cough, sputum, hemoptysis, worsening dyspnea,  wheezing, chest pains, snoring, daytime hypersomnolence, etc... he exercises regularly playing golf, yard work, etc... ~  CXR 2/11 showed clear lungs, some hyperinflation, normal heart, NAD.[name]. ~  CXR 2/14 showed normal heart size, uncoiled Ao, sl hyperinflation & minor scarring, otherw clear/ NAD.  HEART MURMUR >>   HYPERCHOLESTEROLEMIA, BORDERLINE (ICD-272.4) - on diet alone & he has a high HDL... ~  Bradley Meza 1/08 showed TChol 226, TG 42, HDL 89, LDL 110... rec- diet. ~  FLP 1/09 showed TChol 228, TG 45, HDL 82, LDL 109... rec- continue same, needs better diet. ~  FLP 1/10 showed TChol 211, TG 33, HDL 91, LDL 99 ~  FLP 2/11 showed TChol 220, TG 55, HDL 86, LDL 119... rec> better diet vs low dose statin. ~  FLP 2/12 showed TChol 206, TG 34, HDL 93, LDL 98 ~  FLP 2/13 showed TChol 232, TG 44, HDL 84, LDL 126 ~  FLP 2/14 showed TChol 213, TG 43, HDL 82, LDL 105 ~  FLP 5/15 on diet alone showed TChol 207, TG 37, HDL 93, LDL 107   GERD (ICD-530.81) - he had an EGD in 1986 showing 4-5cm HH, reflux esophagitis, and antritis... he denies abd pain, nausea, vomiting, gas, bloating, change in bowel habits, etc... he uses Zantac/ Pepcid as needed...  DIVERTICULOSIS OF COLON (ICD-562.10) - last colonoscopy 3/02  showed only divertics... stool cards completed Feb09 were neg x6...  PROSTATE CANCER (ICD-185) & ELEVATED PROSTATE SPECIFIC ANTIGEN (ICD-790.93) - he is asymtpomatic, denies LTOS, etc... followed by  DrWrenn for Urology w/ Dx= Prostate ca, Elevated PSA, Testic atrophy, Organic impotence... 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 they chose OBSERVATION. ~  f/u Urology 4/10 showed neg exam  and PSA= 3.67... he rec f/u 34yr. ~  labs here 2/11 showed PSA= 4.72 > copy to DrWrenn ~  labs from DrWrenn 12/11 showed PSA= 2.98 ~  Labs 6/12 by DrWrenn w/ PSA= 2.73 & he is doing well w/o voiding problems... ~  2/14:  followed by DrWrenn- Bx pos in 2009 w/ PSA=6.52 but it has been lower ever since on observ alone. ~  8/14:  He had f/u drWrenn and PSA= 3.64, stable...   RENAL CYST/ Stone in right kidney/ Stone in bladder >> followed by DrWrenn & stable...  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 prob w/ R Shoulder reported and pt saw DrPaul w/ shot given (improved)... then developed left shoulder pain- eval by DrPaul 9/10 w/ MRI showing sm rotator cuff tear according to pt- Rx w/ anti-inflamm & PT...  BACK PAIN, LUMBAR (ICD-724.2)  BLOOD DONOR - he is B+ and donates ~ Q90mo...  HEALTH MAINTENANCE:  he thinks he had Pneumovax and Tetanus shots at the health dept in early 2000's... he gets the season Flu vaccines each fall... he takes ASA, MVI, Vit D supplements...   Past Surgical History  Procedure Laterality Date  . Rih repair      Outpatient Encounter Prescriptions as of 08/09/2013  Medication Sig  . aspirin 81 MG tablet Take 81 mg by mouth daily.  . cholecalciferol (VITAMIN D) 1000 UNITS tablet Take 1,000 Units by mouth daily.  . Multiple Vitamins-Minerals (CENTRUM SILVER PO) Take 1 tablet by mouth daily.    No Known Allergies   Current Medications, Allergies, Past Medical History, Past Surgical  History, Family History, and Social History were reviewed in [employer] record.    Review of Systems         See HPI - all other systems neg except as noted...   The patient complains of decreased hearing.  The patient denies anorexia, fever, weight loss, weight gain, vision loss, hoarseness, chest pain, syncope, dyspnea on exertion, peripheral edema, prolonged cough, headaches, hemoptysis, abdominal pain, melena, hematochezia, severe indigestion/heartburn, hematuria, incontinence, muscle weakness, suspicious skin lesions, transient blindness, difficulty walking, depression, unusual weight change, abnormal bleeding, enlarged lymph nodes, and angioedema.    Objective:   Physical Exam     WD, WN, 80 y/o BM in NAD... GENERAL:  Alert & oriented; pleasant & cooperative... HEENT:  Sunrise/AT, EOM-wnl, PERRLA, EACs-clear, TMs-wnl, NOSE-clear, THROAT-clear & wnl. NECK:  Supple w/ fairROM; no JVD; normal carotid impulses w/o bruits; no thyromegaly or nodules palpated; no lymphadenopathy. CHEST:  Clear to P & A; without wheezes/ rales/ or rhonchi. HEART:  Regular Rhythm; Gr2/6 sys murmur at apex, no rubs or gallops... ABDOMEN:  Soft & nontender; normal bowel sounds; no organomegaly or masses detected. EXT: without deformities, mild arthritic changes; no varicose veins/ venous insuffic/ or edema. NEURO:  CN's intact; no focal neuro deficits... DERM:  No lesions noted; no rash etc...  RADIOLOGY DATA:  Reviewed in the EPIC EMR & discussed w/ the patient...    LABORATORY DATA:  Reviewed in the EPIC EMR & discussed w/ the patient...    Assessment:      COPD>  He continues to do well, not requiring meds, gets regular exercise, no exac...  Chol>  FLP not exactly at goal on diet alone but excellent HDL & he declines low dose statin Rx...  GERD>  Known HH w/ hx esoph in past but currently remains asymptomatic & hasn't needed OTC PPI Rx.Marland KitchenMarland Kitchen  Divertics>  Last colon in 2002 w/  divertics only, he is 79 y/o now & asymptomatic...  GU>  Followed by DrWrenn; hx sm focus of prostate ca in prev bx but PSA has been WNL & they continue observation...  DJD>  He has seen DrPaul in the past; he continues to do well w/ exercise & OTC analgesics as needed...  Blood donor>  He is B+ and continues to donate every 47mo...     Plan:     Patient's Medications  New Prescriptions   No medications on file  Previous Medications   ASPIRIN 81 MG TABLET    Take 81 mg by mouth daily.   CHOLECALCIFEROL (VITAMIN D) 1000 UNITS TABLET    Take 1,000 Units by mouth daily.   MULTIPLE VITAMINS-MINERALS (CENTRUM SILVER PO)    Take 1 tablet by mouth daily.  Modified Medications   No medications on file  Discontinued Medications   No medications on file

## 2013-08-09 NOTE — Patient Instructions (Signed)
Today we updated your med list in our EPIC system...    Continue your current medications the same...  Today we did your baseline EKG & your follow up FASTING blood work... We would like to check an ECHOCARDIOGRAM to check your heart valves and the heart's pump function...    We will contact you w/ the results when available...   Continue your same meds, exercise program, etc...  Call for any questions...  Let's plan a follow up visit in 1 year, sooner if needed for problems.Marland KitchenMarland Kitchen

## 2013-08-10 ENCOUNTER — Telehealth: Payer: Self-pay | Admitting: Pulmonary Disease

## 2013-08-10 NOTE — Telephone Encounter (Signed)
Called and spoke with pt and he is aware of lab results per SN. Pt voiced his understanding and nothing further is needed. 

## 2013-08-26 ENCOUNTER — Other Ambulatory Visit (HOSPITAL_COMMUNITY): Payer: Medicare Other

## 2013-11-04 ENCOUNTER — Telehealth: Payer: Self-pay | Admitting: Pulmonary Disease

## 2013-11-04 NOTE — Telephone Encounter (Signed)
lmtcb x1 

## 2013-11-04 NOTE — Telephone Encounter (Signed)
Per SN---   No one doctor in particular to recommend.  This is his insurance company doing this.  SN is happy to see him for any problems.  We can give him [facility] number if he wants to see if he can get set up with them.  thanks

## 2013-11-04 NOTE — Telephone Encounter (Signed)
Pt aware of rec's per SN Pt to contact [facility] at [phone] to establish PCP Nothing further needed.

## 2013-11-04 NOTE — Telephone Encounter (Signed)
Spoke with patient--Pt states that he received a letter from his HMO stating that Dr Lenna Gilford no loner practicing Primary Care and advised the patient to seek a new PCP Pt wanting to know if there is a specific PCP he can recommend the patient see.   Please advise Dr Lenna Gilford. Thanks.

## 2014-02-21 ENCOUNTER — Ambulatory Visit (INDEPENDENT_AMBULATORY_CARE_PROVIDER_SITE_OTHER): Payer: Medicare Other | Admitting: Geriatric Medicine

## 2014-02-21 ENCOUNTER — Encounter: Payer: Self-pay | Admitting: Internal Medicine

## 2014-02-21 ENCOUNTER — Ambulatory Visit (INDEPENDENT_AMBULATORY_CARE_PROVIDER_SITE_OTHER): Payer: Medicare Other | Admitting: Internal Medicine

## 2014-02-21 VITALS — BP 142/78 | HR 61 | Temp 97.5°F | Resp 16 | Ht 72.0 in | Wt 169.0 lb

## 2014-02-21 DIAGNOSIS — Z418 Encounter for other procedures for purposes other than remedying health state: Secondary | ICD-10-CM

## 2014-02-21 DIAGNOSIS — N281 Cyst of kidney, acquired: Secondary | ICD-10-CM

## 2014-02-21 DIAGNOSIS — E785 Hyperlipidemia, unspecified: Secondary | ICD-10-CM

## 2014-02-21 DIAGNOSIS — J449 Chronic obstructive pulmonary disease, unspecified: Secondary | ICD-10-CM

## 2014-02-21 DIAGNOSIS — Z299 Encounter for prophylactic measures, unspecified: Secondary | ICD-10-CM

## 2014-02-21 DIAGNOSIS — C61 Malignant neoplasm of prostate: Secondary | ICD-10-CM

## 2014-02-21 DIAGNOSIS — Z23 Encounter for immunization: Secondary | ICD-10-CM

## 2014-02-21 NOTE — Progress Notes (Signed)
   Subjective:    Patient ID: Bradley Meza, male    DOB: 12-08-1923, 80 y.o.   MRN: [id number]  HPI The patient is an 80 year old man who comes in today to establish care. He does have past medical history of kidney stone, borderline hyperlipidemia, degenerative joint disease, COPD. He states he has no problems with breathing and does not use any inhalers. He is a nonsmoker. He has past medical history of prostate cancer remote. No recent problems. He denies any major joint discomfort at this time. He denies any chest pain, shortness of breath, abdominal pain, constipation, diarrhea. He denies any balance problems and is able to perform his ADLs independently.  Review of Systems  Constitutional: Negative for fever, activity change, appetite change, fatigue and unexpected weight change.  HENT: Negative.   Eyes: Negative.   Respiratory: Negative for cough, chest tightness, shortness of breath and wheezing.   Cardiovascular: Negative for chest pain, palpitations and leg swelling.  Gastrointestinal: Negative for nausea, abdominal pain, diarrhea, constipation and abdominal distention.  Genitourinary: Negative.   Musculoskeletal: Negative.   Skin: Negative.   Neurological: Negative for dizziness, weakness, light-headedness and headaches.      Objective:   Physical Exam  Constitutional: He is oriented to person, place, and time. He appears well-developed and well-nourished.  HENT:  Head: Normocephalic and atraumatic.  Eyes: EOM are normal.  Neck: Normal range of motion.  Cardiovascular: Normal rate, regular rhythm and intact distal pulses.   No murmur heard. Pulmonary/Chest: Effort normal and breath sounds normal. No respiratory distress. He has no wheezes. He has no rales.  Abdominal: Soft. Bowel sounds are normal. He exhibits no distension. There is no tenderness. There is no rebound.  Musculoskeletal: He exhibits no edema.  Neurological: He is alert and oriented to person, place, and time.  Coordination normal.  Skin: Skin is warm and dry.   Filed Vitals:   02/21/14 0941  BP: 142/78  Pulse: 61  Temp: 97.5 F (36.4 C)  TempSrc: Oral  Resp: 16  Height: 6' (1.829 m)  Weight: 169 lb (76.658 kg)  SpO2: 95%      Assessment & Plan:

## 2014-02-21 NOTE — Assessment & Plan Note (Addendum)
Last lipid panel within last year with very good cholesterol. We'll consider this problem inactive.

## 2014-02-21 NOTE — Assessment & Plan Note (Signed)
Unclear to me if patient has COPD. He does not have history of smoking, takes no inhalers, does not complain of shortness of breath. Will check to see if he has ever had PFTs but at this time consider this diagnosis inactive.

## 2014-02-21 NOTE — Progress Notes (Signed)
Pre visit review using our clinic review tool, if applicable. No additional management support is needed unless otherwise documented below in the visit note. 

## 2014-02-21 NOTE — Assessment & Plan Note (Signed)
Described in 2007 is 3 cm possible simple cyst. Never followed up. Will not order imaging at this time.

## 2014-02-21 NOTE — Assessment & Plan Note (Signed)
Unclear history, will check records. Last PSA checked in 2013 was decreasing from prior. Will not recheck at this time.

## 2014-02-21 NOTE — Patient Instructions (Signed)
To Dr. [name]:  We are giving your father a clean bill of health. We gave him a flu shot as well as an update to his pneumonia shot. We are not running any blood work today as it was recently done within the last 6 months. It was a pleasure to meet him and we will see him back in about one year.  If you have any problems or questions before your next visit please feel free to call our office.  Dr. Vertell Novak

## 2014-08-10 ENCOUNTER — Ambulatory Visit: Payer: Medicare Other | Admitting: Pulmonary Disease

## 2015-03-14 ENCOUNTER — Encounter: Payer: Self-pay | Admitting: Internal Medicine

## 2015-03-14 ENCOUNTER — Other Ambulatory Visit (INDEPENDENT_AMBULATORY_CARE_PROVIDER_SITE_OTHER): Payer: Medicare Other

## 2015-03-14 ENCOUNTER — Ambulatory Visit (INDEPENDENT_AMBULATORY_CARE_PROVIDER_SITE_OTHER): Payer: Medicare Other | Admitting: Internal Medicine

## 2015-03-14 VITALS — BP 142/78 | HR 78 | Temp 97.9°F | Resp 18 | Ht 72.0 in | Wt 172.8 lb

## 2015-03-14 DIAGNOSIS — Z23 Encounter for immunization: Secondary | ICD-10-CM | POA: Diagnosis not present

## 2015-03-14 DIAGNOSIS — Z0000 Encounter for general adult medical examination without abnormal findings: Secondary | ICD-10-CM

## 2015-03-14 LAB — COMPREHENSIVE METABOLIC PANEL
ALK PHOS: 49 U/L (ref 39–117)
ALT: 12 U/L (ref 0–53)
AST: 22 U/L (ref 0–37)
Albumin: 4.2 g/dL (ref 3.5–5.2)
BILIRUBIN TOTAL: 0.6 mg/dL (ref 0.2–1.2)
BUN: 16 mg/dL (ref 6–23)
CO2: 30 meq/L (ref 19–32)
Calcium: 9.7 mg/dL (ref 8.4–10.5)
Chloride: 101 mEq/L (ref 96–112)
Creatinine, Ser: 1.22 mg/dL (ref 0.40–1.50)
GFR: 71.79 mL/min (ref >60.00)
GLUCOSE: 88 mg/dL (ref 70–99)
POTASSIUM: 5 meq/L (ref 3.5–5.1)
SODIUM: 137 meq/L (ref 135–145)
TOTAL PROTEIN: 7.3 g/dL (ref 6.0–8.3)

## 2015-03-14 LAB — LIPID PANEL
CHOL/HDL RATIO: 3
Cholesterol: 211 mg/dL — ABNORMAL HIGH (ref 0–200)
HDL: 83.6 mg/dL (ref >39.00)
LDL Cholesterol: 117 mg/dL — ABNORMAL HIGH (ref 0–99)
NONHDL: 127.53
Triglycerides: 52 mg/dL (ref 0.0–149.0)
VLDL: 10.4 mg/dL (ref 0.0–40.0)

## 2015-03-14 NOTE — Progress Notes (Signed)
Pre visit review using our clinic review tool, if applicable. No additional management support is needed unless otherwise documented below in the visit note. 

## 2015-03-14 NOTE — Patient Instructions (Signed)
We are checking the blood work today and will call you back with the results.   Come back next year and call us if you have any problems before then.   Health Maintenance, Male A healthy lifestyle and preventative care can promote health and wellness.  Maintain regular health, dental, and eye exams.  Eat a healthy diet. Foods like vegetables, fruits, whole grains, low-fat dairy products, and lean protein foods contain the nutrients you need and are low in calories. Decrease your intake of foods high in solid fats, added sugars, and salt. Get information about a proper diet from your health care provider, if necessary.  Regular physical exercise is one of the most important things you can do for your health. Most adults should get at least 150 minutes of moderate-intensity exercise (any activity that increases your heart rate and causes you to sweat) each week. In addition, most adults need muscle-strengthening exercises on 2 or more days a week.   Maintain a healthy weight. The body mass index (BMI) is a screening tool to identify possible weight problems. It provides an estimate of body fat based on height and weight. Your health care provider can find your BMI and can help you achieve or maintain a healthy weight. For males 20 years and older:  A BMI below 18.5 is considered underweight.  A BMI of 18.5 to 24.9 is normal.  A BMI of 25 to 29.9 is considered overweight.  A BMI of 30 and above is considered obese.  Maintain normal blood lipids and cholesterol by exercising and minimizing your intake of saturated fat. Eat a balanced diet with plenty of fruits and vegetables. Blood tests for lipids and cholesterol should begin at age 94 and be repeated every 5 years. If your lipid or cholesterol levels are high, you are over age 49, or you are at high risk for heart disease, you may need your cholesterol levels checked more frequently.Ongoing high lipid and cholesterol levels should be treated  with medicines if diet and exercise are not working.  If you smoke, find out from your health care provider how to quit. If you do not use tobacco, do not start.  Lung cancer screening is recommended for adults aged 90-80 years who are at high risk for developing lung cancer because of a history of smoking. A yearly low-dose CT scan of the lungs is recommended for people who have at least a 30-pack-year history of smoking and are current smokers or have quit within the past 15 years. A pack year of smoking is smoking an average of 1 pack of cigarettes a day for 1 year (for example, a 30-pack-year history of smoking could mean smoking 1 pack a day for 30 years or 2 packs a day for 15 years). Yearly screening should continue until the smoker has stopped smoking for at least 15 years. Yearly screening should be stopped for people who develop a health problem that would prevent them from having lung cancer treatment.  If you choose to drink alcohol, do not have more than 2 drinks per day. One drink is considered to be 12 oz (360 mL) of beer, 5 oz (150 mL) of wine, or 1.5 oz (45 mL) of liquor.  Avoid the use of street drugs. Do not share needles with anyone. Ask for help if you need support or instructions about stopping the use of drugs.  High blood pressure causes heart disease and increases the risk of stroke. High blood pressure is more likely  to develop in:  People who have blood pressure in the end of the normal range (100-139/85-89 mm Hg).  People who are overweight or obese.  People who are African American.  If you are 68-44 years of age, have your blood pressure checked every 3-5 years. If you are 39 years of age or older, have your blood pressure checked every year. You should have your blood pressure measured twice--once when you are at a hospital or clinic, and once when you are not at a hospital or clinic. Record the average of the two measurements. To check your blood pressure when you  are not at a hospital or clinic, you can use:  An automated blood pressure machine at a pharmacy.  A home blood pressure monitor.  If you are 79-51 years old, ask your health care provider if you should take aspirin to prevent heart disease.  Diabetes screening involves taking a blood sample to check your fasting blood sugar level. This should be done once every 3 years after age 88 if you are at a normal weight and without risk factors for diabetes. Testing should be considered at a younger age or be carried out more frequently if you are overweight and have at least 1 risk factor for diabetes.  Colorectal cancer can be detected and often prevented. Most routine colorectal cancer screening begins at the age of 48 and continues through age 43. However, your health care provider may recommend screening at an earlier age if you have risk factors for colon cancer. On a yearly basis, your health care provider may provide home test kits to check for hidden blood in the stool. A small camera at the end of a tube may be used to directly examine the colon (sigmoidoscopy or colonoscopy) to detect the earliest forms of colorectal cancer. Talk to your health care provider about this at age 36 when routine screening begins. A direct exam of the colon should be repeated every 5-10 years through age 93, unless early forms of precancerous polyps or small growths are found.  People who are at an increased risk for hepatitis B should be screened for this virus. You are considered at high risk for hepatitis B if:  You were born in a country where hepatitis B occurs often. Talk with your health care provider about which countries are considered high risk.  Your parents were born in a high-risk country and you have not received a shot to protect against hepatitis B (hepatitis B vaccine).  You have HIV or AIDS.  You use needles to inject street drugs.  You live with, or have sex with, someone who has hepatitis  B.  You are a man who has sex with other men (MSM).  You get hemodialysis treatment.  You take certain medicines for conditions like cancer, organ transplantation, and autoimmune conditions.  Hepatitis C blood testing is recommended for all people born from [birth year] through 1965 and any individual with known risk factors for hepatitis C.  Healthy men should no longer receive prostate-specific antigen (PSA) blood tests as part of routine cancer screening. Talk to your health care provider about prostate cancer screening.  Testicular cancer screening is not recommended for adolescents or adult males who have no symptoms. Screening includes self-exam, a health care provider exam, and other screening tests. Consult with your health care provider about any symptoms you have or any concerns you have about testicular cancer.  Practice safe sex. Use condoms and avoid high-risk sexual practices to  reduce the spread of sexually transmitted infections (STIs).  You should be screened for STIs, including gonorrhea and chlamydia if:  You are sexually active and are younger than 24 years.  You are older than 24 years, and your health care provider tells you that you are at risk for this type of infection.  Your sexual activity has changed since you were last screened, and you are at an increased risk for chlamydia or gonorrhea. Ask your health care provider if you are at risk.  If you are at risk of being infected with HIV, it is recommended that you take a prescription medicine daily to prevent HIV infection. This is called pre-exposure prophylaxis (PrEP). You are considered at risk if:  You are a man who has sex with other men (MSM).  You are a heterosexual man who is sexually active with multiple partners.  You take drugs by injection.  You are sexually active with a partner who has HIV.  Talk with your health care provider about whether you are at high risk of being infected with HIV. If you  choose to begin PrEP, you should first be tested for HIV. You should then be tested every 3 months for as long as you are taking PrEP.  Use sunscreen. Apply sunscreen liberally and repeatedly throughout the day. You should seek shade when your shadow is shorter than you. Protect yourself by wearing long sleeves, pants, a wide-brimmed hat, and sunglasses year round whenever you are outdoors.  Tell your health care provider of new moles or changes in moles, especially if there is a change in shape or color. Also, tell your health care provider if a mole is larger than the size of a pencil eraser.  A one-time screening for abdominal aortic aneurysm (AAA) and surgical repair of large AAAs by ultrasound is recommended for men aged 37-75 years who are current or former smokers.  Stay current with your vaccines (immunizations).   This information is not intended to replace advice given to you by your health care provider. Make sure you discuss any questions you have with your health care provider.   Document Released: 09/14/2007 Document Revised: 04/08/2014 Document Reviewed: 08/13/2010 Elsevier Interactive Patient Education [employer].

## 2015-03-14 NOTE — Assessment & Plan Note (Signed)
Checking labs today, non-smoker and exercises well. No real health concerns. Counseled about balance and home safety. Given 10 year screening recommendations.

## 2015-03-14 NOTE — Progress Notes (Signed)
   Subjective:    Patient ID: Bradley Meza, male    DOB: 1923/05/20, 81 y.o.   MRN: [id number]  HPI Here for medicare wellness, no new complaints. Please see A/P for status and treatment of chronic medical problems.   Diet: heart healthy  Physical activity: active Depression/mood screen: negative Hearing: intact to whispered voice Visual acuity: grossly normal, performs annual eye exam  ADLs: capable Fall risk: none Home safety: good Cognitive evaluation: intact to orientation, naming, recall and repetition EOL planning: adv directives discussed  I have personally reviewed and have noted 1. The patient's medical and social history - reviewed today no changes 2. Their use of alcohol, tobacco or illicit drugs 3. Their current medications and supplements 4. The patient's functional ability including ADL's, fall risks, home safety risks and hearing or visual impairment. 5. Diet and physical activities 6. Evidence for depression or mood disorders 7. Care team reviewed and updated (available in snapshot)  Review of Systems  Constitutional: Negative for fever, activity change, appetite change, fatigue and unexpected weight change.  HENT: Negative.   Eyes: Negative.   Respiratory: Negative for cough, chest tightness, shortness of breath and wheezing.   Cardiovascular: Negative for chest pain, palpitations and leg swelling.  Gastrointestinal: Negative for nausea, abdominal pain, diarrhea, constipation and abdominal distention.  Genitourinary: Negative.   Musculoskeletal: Negative.   Skin: Negative.   Neurological: Negative for dizziness, weakness, light-headedness and headaches.  Psychiatric/Behavioral: Negative.       Objective:   Physical Exam  Constitutional: He is oriented to person, place, and time. He appears well-developed and well-nourished.  HENT:  Head: Normocephalic and atraumatic.  Eyes: EOM are normal.  Neck: Normal range of motion.  Cardiovascular: Normal rate,  regular rhythm and intact distal pulses.   No murmur heard. Pulmonary/Chest: Effort normal and breath sounds normal. No respiratory distress. He has no wheezes. He has no rales.  Abdominal: Soft. Bowel sounds are normal. He exhibits no distension. There is no tenderness. There is no rebound.  Musculoskeletal: He exhibits no edema.  Neurological: He is alert and oriented to person, place, and time. Coordination normal.  Skin: Skin is warm and dry.   Filed Vitals:   03/14/15 1535  BP: 142/78  Pulse: 78  Temp: 97.9 F (36.6 C)  TempSrc: Oral  Resp: 18  Height: 6' (1.829 m)  Weight: 172 lb 12.8 oz (78.382 kg)  SpO2: 94%      Assessment & Plan:  Prevnar 13 and flu shot given at visit.

## 2015-03-15 NOTE — Addendum Note (Signed)
Addended by: Resa Miner R on: 03/15/2015 09:25 AM   Modules accepted: Orders

## 2015-05-30 DIAGNOSIS — H524 Presbyopia: Secondary | ICD-10-CM | POA: Diagnosis not present

## 2015-06-05 DIAGNOSIS — R972 Elevated prostate specific antigen [PSA]: Secondary | ICD-10-CM | POA: Diagnosis not present

## 2015-06-12 DIAGNOSIS — Z0000 Encounter for general adult medical examination without abnormal findings: Secondary | ICD-10-CM | POA: Diagnosis not present

## 2015-06-12 DIAGNOSIS — N200 Calculus of kidney: Secondary | ICD-10-CM | POA: Diagnosis not present

## 2015-06-12 DIAGNOSIS — R3121 Asymptomatic microscopic hematuria: Secondary | ICD-10-CM | POA: Diagnosis not present

## 2015-06-12 DIAGNOSIS — R972 Elevated prostate specific antigen [PSA]: Secondary | ICD-10-CM | POA: Diagnosis not present

## 2015-06-27 DIAGNOSIS — N200 Calculus of kidney: Secondary | ICD-10-CM | POA: Diagnosis not present

## 2015-06-27 DIAGNOSIS — R3121 Asymptomatic microscopic hematuria: Secondary | ICD-10-CM | POA: Diagnosis not present

## 2015-07-12 DIAGNOSIS — R3121 Asymptomatic microscopic hematuria: Secondary | ICD-10-CM | POA: Diagnosis not present

## 2015-07-12 DIAGNOSIS — N200 Calculus of kidney: Secondary | ICD-10-CM | POA: Diagnosis not present

## 2015-07-12 DIAGNOSIS — N281 Cyst of kidney, acquired: Secondary | ICD-10-CM | POA: Diagnosis not present

## 2015-07-12 DIAGNOSIS — N402 Nodular prostate without lower urinary tract symptoms: Secondary | ICD-10-CM | POA: Diagnosis not present

## 2015-12-11 DIAGNOSIS — R972 Elevated prostate specific antigen [PSA]: Secondary | ICD-10-CM | POA: Diagnosis not present

## 2015-12-18 DIAGNOSIS — N402 Nodular prostate without lower urinary tract symptoms: Secondary | ICD-10-CM | POA: Diagnosis not present

## 2015-12-18 DIAGNOSIS — N200 Calculus of kidney: Secondary | ICD-10-CM | POA: Diagnosis not present

## 2015-12-18 DIAGNOSIS — R972 Elevated prostate specific antigen [PSA]: Secondary | ICD-10-CM | POA: Diagnosis not present

## 2016-03-18 ENCOUNTER — Ambulatory Visit (INDEPENDENT_AMBULATORY_CARE_PROVIDER_SITE_OTHER): Payer: Medicare Other | Admitting: Internal Medicine

## 2016-03-18 ENCOUNTER — Other Ambulatory Visit (INDEPENDENT_AMBULATORY_CARE_PROVIDER_SITE_OTHER): Payer: Medicare Other

## 2016-03-18 ENCOUNTER — Encounter: Payer: Self-pay | Admitting: Internal Medicine

## 2016-03-18 VITALS — BP 140/70 | HR 78 | Temp 97.9°F | Resp 14 | Ht 72.0 in | Wt 167.0 lb

## 2016-03-18 DIAGNOSIS — N200 Calculus of kidney: Secondary | ICD-10-CM | POA: Diagnosis not present

## 2016-03-18 DIAGNOSIS — Z23 Encounter for immunization: Secondary | ICD-10-CM | POA: Diagnosis not present

## 2016-03-18 DIAGNOSIS — Z0000 Encounter for general adult medical examination without abnormal findings: Secondary | ICD-10-CM

## 2016-03-18 LAB — LIPID PANEL
CHOLESTEROL: 224 mg/dL — AB (ref 0–200)
HDL: 79.4 mg/dL (ref >39.00)
LDL Cholesterol: 128 mg/dL — ABNORMAL HIGH (ref 0–99)
NONHDL: 144.47
TRIGLYCERIDES: 83 mg/dL (ref 0.0–149.0)
Total CHOL/HDL Ratio: 3
VLDL: 16.6 mg/dL (ref 0.0–40.0)

## 2016-03-18 LAB — COMPREHENSIVE METABOLIC PANEL
ALBUMIN: 4.2 g/dL (ref 3.5–5.2)
ALK PHOS: 51 U/L (ref 39–117)
ALT: 12 U/L (ref 0–53)
AST: 25 U/L (ref 0–37)
BILIRUBIN TOTAL: 0.7 mg/dL (ref 0.2–1.2)
BUN: 20 mg/dL (ref 6–23)
CO2: 30 mEq/L (ref 19–32)
Calcium: 9.6 mg/dL (ref 8.4–10.5)
Chloride: 104 mEq/L (ref 96–112)
Creatinine, Ser: 1.33 mg/dL (ref 0.40–1.50)
GFR: 64.84 mL/min (ref >60.00)
Glucose, Bld: 101 mg/dL — ABNORMAL HIGH (ref 70–99)
POTASSIUM: 4 meq/L (ref 3.5–5.1)
Sodium: 143 mEq/L (ref 135–145)
TOTAL PROTEIN: 7.1 g/dL (ref 6.0–8.3)

## 2016-03-18 NOTE — Assessment & Plan Note (Signed)
Checking labs, given flu shot. Declines shingles. Pneumonia and tetanus up to date. Aged out of colonoscopy. Counseled on sun safety and mole surveillance as well as home safety. Given 10 year screening recommendations.

## 2016-03-18 NOTE — Patient Instructions (Signed)
We have given you the flu shot today and checked the labs.   You are doing a great job with the health so keep up the good work!  Health Maintenance, Male A healthy lifestyle and preventative care can promote health and wellness.  Maintain regular health, dental, and eye exams.  Eat a healthy diet. Foods like vegetables, fruits, whole grains, low-fat dairy products, and lean protein foods contain the nutrients you need and are low in calories. Decrease your intake of foods high in solid fats, added sugars, and salt. Get information about a proper diet from your health care provider, if necessary.  Regular physical exercise is one of the most important things you can do for your health. Most adults should get at least 150 minutes of moderate-intensity exercise (any activity that increases your heart rate and causes you to sweat) each week. In addition, most adults need muscle-strengthening exercises on 2 or more days a week.   Maintain a healthy weight. The body mass index (BMI) is a screening tool to identify possible weight problems. It provides an estimate of body fat based on height and weight. Your health care provider can find your BMI and can help you achieve or maintain a healthy weight. For males 20 years and older:  A BMI below 18.5 is considered underweight.  A BMI of 18.5 to 24.9 is normal.  A BMI of 25 to 29.9 is considered overweight.  A BMI of 30 and above is considered obese.  Maintain normal blood lipids and cholesterol by exercising and minimizing your intake of saturated fat. Eat a balanced diet with plenty of fruits and vegetables. Blood tests for lipids and cholesterol should begin at age 47 and be repeated every 5 years. If your lipid or cholesterol levels are high, you are over age 34, or you are at high risk for heart disease, you may need your cholesterol levels checked more frequently.Ongoing high lipid and cholesterol levels should be treated with medicines if diet  and exercise are not working.  If you smoke, find out from your health care provider how to quit. If you do not use tobacco, do not start.  Lung cancer screening is recommended for adults aged 54-80 years who are at high risk for developing lung cancer because of a history of smoking. A yearly low-dose CT scan of the lungs is recommended for people who have at least a 30-pack-year history of smoking and are current smokers or have quit within the past 15 years. A pack year of smoking is smoking an average of 1 pack of cigarettes a day for 1 year (for example, a 30-pack-year history of smoking could mean smoking 1 pack a day for 30 years or 2 packs a day for 15 years). Yearly screening should continue until the smoker has stopped smoking for at least 15 years. Yearly screening should be stopped for people who develop a health problem that would prevent them from having lung cancer treatment.  If you choose to drink alcohol, do not have more than 2 drinks per day. One drink is considered to be 12 oz (360 mL) of beer, 5 oz (150 mL) of wine, or 1.5 oz (45 mL) of liquor.  Avoid the use of street drugs. Do not share needles with anyone. Ask for help if you need support or instructions about stopping the use of drugs.  High blood pressure causes heart disease and increases the risk of stroke. High blood pressure is more likely to develop in:  People who have blood pressure in the end of the normal range (100-139/85-89 mm Hg).  People who are overweight or obese.  People who are African American.  If you are 45-23 years of age, have your blood pressure checked every 3-5 years. If you are 37 years of age or older, have your blood pressure checked every year. You should have your blood pressure measured twice-once when you are at a hospital or clinic, and once when you are not at a hospital or clinic. Record the average of the two measurements. To check your blood pressure when you are not at a hospital or  clinic, you can use:  An automated blood pressure machine at a pharmacy.  A home blood pressure monitor.  If you are 30-40 years old, ask your health care provider if you should take aspirin to prevent heart disease.  Diabetes screening involves taking a blood sample to check your fasting blood sugar level. This should be done once every 3 years after age 29 if you are at a normal weight and without risk factors for diabetes. Testing should be considered at a younger age or be carried out more frequently if you are overweight and have at least 1 risk factor for diabetes.  Colorectal cancer can be detected and often prevented. Most routine colorectal cancer screening begins at the age of 47 and continues through age 55. However, your health care provider may recommend screening at an earlier age if you have risk factors for colon cancer. On a yearly basis, your health care provider may provide home test kits to check for hidden blood in the stool. A small camera at the end of a tube may be used to directly examine the colon (sigmoidoscopy or colonoscopy) to detect the earliest forms of colorectal cancer. Talk to your health care provider about this at age 69 when routine screening begins. A direct exam of the colon should be repeated every 5-10 years through age 42, unless early forms of precancerous polyps or small growths are found.  People who are at an increased risk for hepatitis B should be screened for this virus. You are considered at high risk for hepatitis B if:  You were born in a country where hepatitis B occurs often. Talk with your health care provider about which countries are considered high risk.  Your parents were born in a high-risk country and you have not received a shot to protect against hepatitis B (hepatitis B vaccine).  You have HIV or AIDS.  You use needles to inject street drugs.  You live with, or have sex with, someone who has hepatitis B.  You are a man who has  sex with other men (MSM).  You get hemodialysis treatment.  You take certain medicines for conditions like cancer, organ transplantation, and autoimmune conditions.  Hepatitis C blood testing is recommended for all people born from [birth year] through 1965 and any individual with known risk factors for hepatitis C.  Healthy men should no longer receive prostate-specific antigen (PSA) blood tests as part of routine cancer screening. Talk to your health care provider about prostate cancer screening.  Testicular cancer screening is not recommended for adolescents or adult males who have no symptoms. Screening includes self-exam, a health care provider exam, and other screening tests. Consult with your health care provider about any symptoms you have or any concerns you have about testicular cancer.  Practice safe sex. Use condoms and avoid high-risk sexual practices to reduce the spread of  sexually transmitted infections (STIs).  You should be screened for STIs, including gonorrhea and chlamydia if:  You are sexually active and are younger than 24 years.  You are older than 24 years, and your health care provider tells you that you are at risk for this type of infection.  Your sexual activity has changed since you were last screened, and you are at an increased risk for chlamydia or gonorrhea. Ask your health care provider if you are at risk.  If you are at risk of being infected with HIV, it is recommended that you take a prescription medicine daily to prevent HIV infection. This is called pre-exposure prophylaxis (PrEP). You are considered at risk if:  You are a man who has sex with other men (MSM).  You are a heterosexual man who is sexually active with multiple partners.  You take drugs by injection.  You are sexually active with a partner who has HIV.  Talk with your health care provider about whether you are at high risk of being infected with HIV. If you choose to begin PrEP, you should  first be tested for HIV. You should then be tested every 3 months for as long as you are taking PrEP.  Use sunscreen. Apply sunscreen liberally and repeatedly throughout the day. You should seek shade when your shadow is shorter than you. Protect yourself by wearing long sleeves, pants, a wide-brimmed hat, and sunglasses year round whenever you are outdoors.  Tell your health care provider of new moles or changes in moles, especially if there is a change in shape or color. Also, tell your health care provider if a mole is larger than the size of a pencil eraser.  A one-time screening for abdominal aortic aneurysm (AAA) and surgical repair of large AAAs by ultrasound is recommended for men aged 13-75 years who are current or former smokers.  Stay current with your vaccines (immunizations). This information is not intended to replace advice given to you by your health care provider. Make sure you discuss any questions you have with your health care provider. Document Released: 09/14/2007 Document Revised: 04/08/2014 Document Reviewed: 12/20/2014 Elsevier Interactive Patient Education  2017 [employer].

## 2016-03-18 NOTE — Progress Notes (Signed)
   Subjective:    Patient ID: Bradley Meza, male    DOB: Sep 06, 1923, 82 y.o.   MRN: [id number]  HPI Here for medicare wellness and physical, no new complaints. Please see A/P for status and treatment of chronic medical problems.   Diet: heart healthy Physical activity: active Depression/mood screen: negative Hearing: intact to whispered voice Visual acuity: grossly normal, performs annual eye exam  ADLs: capable Fall risk: none Home safety: good Cognitive evaluation: intact to orientation, naming, recall and repetition EOL planning: adv directives discussed, in place  I have personally reviewed and have noted 1. The patient's medical and social history - reviewed today no changes 2. Their use of alcohol, tobacco or illicit drugs 3. Their current medications and supplements 4. The patient's functional ability including ADL's, fall risks, home safety risks and hearing or visual impairment. 5. Diet and physical activities 6. Evidence for depression or mood disorders 7. Care team reviewed and updated (available in snapshot)  Review of Systems  Constitutional: Negative.   HENT: Negative.   Eyes: Negative.   Respiratory: Negative for cough, chest tightness and shortness of breath.   Cardiovascular: Negative for chest pain, palpitations and leg swelling.  Gastrointestinal: Negative for abdominal distention, abdominal pain, constipation, diarrhea, nausea and vomiting.  Musculoskeletal: Negative.   Skin: Negative.   Neurological: Negative.   Psychiatric/Behavioral: Negative.       Objective:   Physical Exam  Constitutional: He is oriented to person, place, and time. He appears well-developed and well-nourished.  HENT:  Head: Normocephalic and atraumatic.  Eyes: EOM are normal.  Neck: Normal range of motion.  Cardiovascular: Normal rate and regular rhythm.   Pulmonary/Chest: Effort normal and breath sounds normal. No respiratory distress. He has no wheezes. He has no rales.    Abdominal: Soft. Bowel sounds are normal. He exhibits no distension. There is no tenderness. There is no rebound.  Musculoskeletal: He exhibits no edema.  Neurological: He is alert and oriented to person, place, and time. Coordination normal.  Skin: Skin is warm and dry.  Psychiatric: He has a normal mood and affect.   Vitals:   03/18/16 1439  BP: (!) 160/80  Pulse: 78  Resp: 14  Temp: 97.9 F (36.6 C)  TempSrc: Oral  SpO2: 96%  Weight: 167 lb (75.8 kg)  Height: 6' (1.829 m)      Assessment & Plan:  Flu shot given at visit.

## 2016-03-18 NOTE — Assessment & Plan Note (Addendum)
Sees urology and they are monitoring. Asymptomatic.

## 2016-03-18 NOTE — Progress Notes (Signed)
Pre visit review using our clinic review tool, if applicable. No additional management support is needed unless otherwise documented below in the visit note. 

## 2016-06-12 DIAGNOSIS — N200 Calculus of kidney: Secondary | ICD-10-CM | POA: Diagnosis not present

## 2016-06-20 ENCOUNTER — Emergency Department (HOSPITAL_COMMUNITY)
Admission: EM | Admit: 2016-06-20 | Discharge: 2016-06-20 | Disposition: A | Discharge status: Home / Self Care | Payer: Medicare Other | Attending: Emergency Medicine | Admitting: Emergency Medicine

## 2016-06-20 DIAGNOSIS — Z87891 Personal history of nicotine dependence: Secondary | ICD-10-CM | POA: Insufficient documentation

## 2016-06-20 DIAGNOSIS — Z79899 Other long term (current) drug therapy: Secondary | ICD-10-CM | POA: Insufficient documentation

## 2016-06-20 DIAGNOSIS — R42 Dizziness and giddiness: Secondary | ICD-10-CM | POA: Diagnosis not present

## 2016-06-20 DIAGNOSIS — W06XXXA Fall from bed, initial encounter: Secondary | ICD-10-CM | POA: Diagnosis not present

## 2016-06-20 DIAGNOSIS — Z8546 Personal history of malignant neoplasm of prostate: Secondary | ICD-10-CM | POA: Diagnosis not present

## 2016-06-20 DIAGNOSIS — Y939 Activity, unspecified: Secondary | ICD-10-CM | POA: Insufficient documentation

## 2016-06-20 DIAGNOSIS — Z7982 Long term (current) use of aspirin: Secondary | ICD-10-CM | POA: Diagnosis not present

## 2016-06-20 DIAGNOSIS — R9431 Abnormal electrocardiogram [ECG] [EKG]: Secondary | ICD-10-CM | POA: Diagnosis not present

## 2016-06-20 DIAGNOSIS — R319 Hematuria, unspecified: Secondary | ICD-10-CM | POA: Diagnosis not present

## 2016-06-20 DIAGNOSIS — J449 Chronic obstructive pulmonary disease, unspecified: Secondary | ICD-10-CM | POA: Diagnosis not present

## 2016-06-20 DIAGNOSIS — Y999 Unspecified external cause status: Secondary | ICD-10-CM | POA: Insufficient documentation

## 2016-06-20 DIAGNOSIS — Y929 Unspecified place or not applicable: Secondary | ICD-10-CM | POA: Insufficient documentation

## 2016-06-20 LAB — URINALYSIS, ROUTINE W REFLEX MICROSCOPIC
Bacteria, UA: NONE SEEN
Bilirubin Urine: NEGATIVE
GLUCOSE, UA: NEGATIVE mg/dL
Ketones, ur: NEGATIVE mg/dL
Leukocytes, UA: NEGATIVE
Nitrite: NEGATIVE
Protein, ur: 100 mg/dL — AB
Specific Gravity, Urine: 1.018 (ref 1.005–1.030)
Squamous Epithelial / HPF: NONE SEEN
WBC, UA: NONE SEEN WBC/hpf (ref 0–5)
pH: 7 (ref 5.0–8.0)

## 2016-06-20 LAB — BASIC METABOLIC PANEL
Anion gap: 8 (ref 5–15)
BUN: 16 mg/dL (ref 6–20)
CALCIUM: 9.2 mg/dL (ref 8.9–10.3)
CO2: 26 mmol/L (ref 22–32)
CREATININE: 1.25 mg/dL — AB (ref 0.61–1.24)
Chloride: 104 mmol/L (ref 101–111)
GFR, EST AFRICAN AMERICAN: 56 mL/min — AB (ref >60)
GFR, EST NON AFRICAN AMERICAN: 49 mL/min — AB (ref >60)
Glucose, Bld: 118 mg/dL — ABNORMAL HIGH (ref 65–99)
Potassium: 4.9 mmol/L (ref 3.5–5.1)
SODIUM: 138 mmol/L (ref 135–145)

## 2016-06-20 LAB — CBC
HCT: 40.9 % (ref 39.0–52.0)
Hemoglobin: 13.6 g/dL (ref 13.0–17.0)
MCH: 28.9 pg (ref 26.0–34.0)
MCHC: 33.3 g/dL (ref 30.0–36.0)
MCV: 86.8 fL (ref 78.0–100.0)
PLATELETS: 152 10*3/uL (ref 150–400)
RBC: 4.71 MIL/uL (ref 4.22–5.81)
RDW: 13.9 % (ref 11.5–15.5)
WBC: 16.3 10*3/uL — ABNORMAL HIGH (ref 4.0–10.5)

## 2016-06-20 LAB — CBG MONITORING, ED: Glucose-Capillary: 82 mg/dL (ref 65–99)

## 2016-06-20 MED ORDER — SODIUM CHLORIDE 0.9 % IV BOLUS (SEPSIS)
500.0000 mL | Freq: Once | INTRAVENOUS | Status: AC
  Administered 2016-06-20: 500 mL via INTRAVENOUS

## 2016-06-20 MED ORDER — LORAZEPAM 2 MG/ML IJ SOLN
0.5000 mg | Freq: Once | INTRAMUSCULAR | Status: AC
  Administered 2016-06-20: 0.5 mg via INTRAVENOUS
  Filled 2016-06-20: qty 1

## 2016-06-20 MED ORDER — LORAZEPAM 0.5 MG PO TABS: 1.0000 mg | ORAL_TABLET | Freq: Three times a day (TID) | ORAL | 0 refills | Status: DC | PRN

## 2016-06-20 NOTE — ED Provider Notes (Signed)
[facility] DEPT Provider Note   CSN: [id number] Arrival date & time: 06/20/16  0941     History   Chief Complaint Chief Complaint  Patient presents with  . Dizziness  . Emesis  . Fall    HPI Bradley Meza is a 83 y.o. male.  Patient presents for evaluation of a dizzy feeling, which he noticed when he initially got out of bed today.  He stood up, but his legs would not support him and he crawled to the bathroom.  He was nauseated time, then vomited.  He relates this discomfort to eating a bag of potato chips, last night which was "stable".  He does not usually eat potato chips.  He tries to only eat vegetarian foods and typically avoids high-fat foods.  Yesterday he did not drink as much fluid or eat as much, as usual, because he was not able to get much exercise.  He denies recent fever chills cough shortness of breath nausea vomiting focal weakness or paresthesia.  No ongoing or recurrent dizziness spells.  There are no other known modifying factors.  HPI  Past Medical History:  Diagnosis Date  . Allergy history unknown   . Blood donor, other   . COPD (chronic obstructive pulmonary disease) ([place])   . Diverticulosis of colon   . DJD (degenerative joint disease)   . GERD (gastroesophageal reflux disease)   . Hypercholesteremia   . Lumbar back pain   . Prostate cancer ([place])   . Renal cyst     Patient Active Problem List   Diagnosis Date Noted  . Routine general medical examination at a health care facility 03/14/2015  . Kidney stone 08/09/2013  . PROSTATE CANCER 05/18/2009    Past Surgical History:  Procedure Laterality Date  . RIH repair         Home Medications    Prior to Admission medications   Medication Sig Start Date End Date Taking? Authorizing Provider  aspirin 81 MG tablet Take 81 mg by mouth daily.   Yes Historical Provider, MD  cholecalciferol (VITAMIN D) 1000 UNITS tablet Take 1,000 Units by mouth daily.   Yes Historical Provider, MD  Multiple  Vitamins-Minerals (CENTRUM SILVER PO) Take 1 tablet by mouth daily.   Yes Historical Provider, MD  LORazepam (ATIVAN) 0.5 MG tablet Take 2 tablets (1 mg total) by mouth every 8 (eight) hours as needed (vertigo). 06/20/16   Daleen Bo, MD    Family History Family History  Problem Relation Age of Onset  . Arthritis Mother     Social History Social History  Substance Use Topics  . Smoking status: Former Smoker    Types: Cigarettes    Quit date: 04/01/1988  . Smokeless tobacco: Former Systems developer  . Alcohol use Yes     Comment: social use     Allergies   Patient has no known allergies.   Review of Systems Review of Systems  All other systems reviewed and are negative.    Physical Exam Updated Vital Signs BP (!) 144/76 (BP Location: Left Arm)   Pulse 63   Temp 97.8 F (36.6 C) (Oral)   Resp 15   SpO2 98%   Physical Exam  Constitutional: He is oriented to person, place, and time. He appears well-developed and well-nourished. No distress.  HENT:  Head: Normocephalic and atraumatic.  Right Ear: External ear normal.  Left Ear: External ear normal.  Eyes: Conjunctivae and EOM are normal. Pupils are equal, round, and reactive to light.  Neck:  Normal range of motion and phonation normal. Neck supple.  Cardiovascular: Normal rate, regular rhythm and normal heart sounds.   Pulmonary/Chest: Effort normal and breath sounds normal. He exhibits no bony tenderness.  Abdominal: Soft. There is no tenderness.  Musculoskeletal: Normal range of motion.  Neurological: He is alert and oriented to person, place, and time. No cranial nerve deficit or sensory deficit. He exhibits normal muscle tone. Coordination normal.  No dysarthria aphasia or nystagmus.  No pronator drift.  No ataxia.  Skin: Skin is warm, dry and intact.  Psychiatric: He has a normal mood and affect. His behavior is normal. Judgment and thought content normal.  Nursing note and vitals reviewed.    ED Treatments / Results    Labs (all labs ordered are listed, but only abnormal results are displayed) Labs Reviewed  BASIC METABOLIC PANEL - Abnormal; Notable for the following:       Result Value   Glucose, Bld 118 (*)    Creatinine, Ser 1.25 (*)    GFR calc non Af Amer 49 (*)    GFR calc Af Amer 56 (*)    All other components within normal limits  CBC - Abnormal; Notable for the following:    WBC 16.3 (*)    All other components within normal limits  URINALYSIS, ROUTINE W REFLEX MICROSCOPIC - Abnormal; Notable for the following:    Color, Urine RED (*)    APPearance CLOUDY (*)    Hgb urine dipstick LARGE (*)    Protein, ur 100 (*)    All other components within normal limits  CBG MONITORING, ED    EKG  EKG Interpretation  Date/Time:  Thursday June 20 2016 10:03:21 EDT Ventricular Rate:  72 PR Interval:    QRS Duration: 90 QT Interval:  410 QTC Calculation: 449 R Axis:   38 Text Interpretation:  Sinus rhythm Multiple premature complexes, vent & supraven No old tracing to compare Confirmed by [facility]  MD, Bahja Bence [phone]) on 06/20/2016 1:38:37 PM       Radiology No results found.  Procedures Procedures (including critical care time)  Medications Ordered in ED Medications  sodium chloride 0.9 % bolus 500 mL (0 mLs Intravenous Stopped 06/20/16 1233)  LORazepam (ATIVAN) injection 0.5 mg (0.5 mg Intravenous Given 06/20/16 1056)     Initial Impression / Assessment and Plan / ED Course  I have reviewed the triage vital signs and the nursing notes.  Pertinent labs & imaging results that were available during my care of the patient were reviewed by me and considered in my medical decision making (see chart for details).  Clinical Course as of Jun 21 1450  Thu Jun 20, 2016  1337 High Hgb urine dipstick: (!) LARGE [EW]  1337 I RBC / HPF: TOO NUMEROUS TO COUNT [EW]  1337 Low EGFR (African American): (!) 56 [EW]  1337 High WBC: (!) 16.3 [EW]    Clinical Course User Index [EW] Daleen Bo, MD     Medications  sodium chloride 0.9 % bolus 500 mL (0 mLs Intravenous Stopped 06/20/16 1233)  LORazepam (ATIVAN) injection 0.5 mg (0.5 mg Intravenous Given 06/20/16 1056)    Patient Vitals for the past 24 hrs:  BP Temp Temp src Pulse Resp SpO2  06/20/16 1230 (!) 144/76 - - 63 15 98 %  06/20/16 0952 (!) 175/88 97.8 F (36.6 C) Oral 74 16 96 %    2:52 PM Reevaluation with update and discussion. After initial assessment and treatment, an updated evaluation reveals  patient somewhat sleepy now, after treatment, but states his dizziness has improved.  He was able to ambulate without ataxia and felt comfortable walking.  Patient's daughter and patient were informed of the findings and questions were answered. Izza Bickle L    Final Clinical Impressions(s) / ED Diagnoses   Final diagnoses:  Vertigo  Hematuria, unspecified type    Nonspecific vertigo, doubt CVA, brain tumor, serious bacterial infection or metabolic instability.  Incidental hematuria, without hemodynamic compromise.  Patient is actively seeing urology, and his daughter is not sure what for.  Nursing Notes Reviewed/ Care Coordinated Applicable Imaging Reviewed Interpretation of Laboratory Data incorporated into ED treatment  The patient appears reasonably screened and/or stabilized for discharge and I doubt any other medical condition or other [facility] requiring further screening, evaluation, or treatment in the ED at this time prior to discharge.  Plan: Home Medications-OTC symptom relief; Home Treatments-rest, fluids, be careful when ambulating, and no driving within 10 hours of using Ativan; return here if the recommended treatment, does not improve the symptoms; Recommended follow up-PCP 1 week, urology 1 week   New Prescriptions New Prescriptions   LORAZEPAM (ATIVAN) 0.5 MG TABLET    Take 2 tablets (1 mg total) by mouth every 8 (eight) hours as needed (vertigo).     Daleen Bo, MD 06/20/16 [phone]

## 2016-06-20 NOTE — ED Triage Notes (Signed)
Pt reports dizziness that began last night and awakened today dizzy, vomitted and fell out of bed. Pt denies hitting his head. Pt reports his left elbow going to his left side, now has pain to left side. Pt A+OX4 w/ unsteady gait.

## 2016-06-20 NOTE — ED Notes (Signed)
Pt ambulated in hall without any difficulty. Pt denies dizziness and nausea at this time.

## 2016-06-20 NOTE — Discharge Instructions (Signed)
Make sure you are eating 3 meals a day and drinking plenty of fluids, especially water.  We are prescribing a medication called Lorazepam to use 1 pill 3 times a day if needed for dizziness.  When you are taking this medication, be careful about standing up and walking, because it can make you weak and sleepy.  Do not drive within 10 hours of taking the medication, lorazepam.  It is important to follow up with your primary care doctor for a checkup next week.  Call your urologist regarding the hematuria, to see if he wants to see you for it.

## 2016-06-24 ENCOUNTER — Encounter: Payer: Self-pay | Admitting: Internal Medicine

## 2016-06-24 ENCOUNTER — Ambulatory Visit (INDEPENDENT_AMBULATORY_CARE_PROVIDER_SITE_OTHER): Payer: Medicare Other | Admitting: Internal Medicine

## 2016-06-24 DIAGNOSIS — R42 Dizziness and giddiness: Secondary | ICD-10-CM

## 2016-06-24 NOTE — Progress Notes (Signed)
   Subjective:    Patient ID: Panfilo Ketchum, male    DOB: [birth year], 83 y.o.   MRN: [id number]  HPI The patient is an 83 YO man coming in for ER follow up for vertigo. He was not eating or drinking well for several days prior to this event. His daughter is with him and helps to provide history. He normally is very active and likes to be outside most of the day. He does forget to eat and drink well while outside. He used to drink some boost but has not in some time. Not having the symptoms anymore and he is working on eating more and drinking more.   Review of Systems  Constitutional: Positive for activity change and appetite change. Negative for chills, diaphoresis, fatigue, fever and unexpected weight change.  Respiratory: Negative.   Cardiovascular: Negative.   Gastrointestinal: Negative.   Musculoskeletal: Negative.   Skin: Negative.   Neurological: Positive for dizziness. Negative for tremors, seizures, facial asymmetry, weakness, light-headedness, numbness and headaches.      Objective:   Physical Exam  Constitutional: He is oriented to person, place, and time. He appears well-developed and well-nourished.  HENT:  Head: Normocephalic and atraumatic.  Eyes: EOM are normal.  Neck: Normal range of motion.  Cardiovascular: Normal rate and regular rhythm.   Pulmonary/Chest: Effort normal. No respiratory distress. He has no wheezes. He has no rales. He exhibits no tenderness.  Abdominal: Soft. He exhibits no distension. There is no tenderness. There is no rebound.  Musculoskeletal: He exhibits no edema.  Neurological: He is alert and oriented to person, place, and time. Coordination normal.  No symptoms with standing.   Skin: Skin is warm and dry.   Vitals:   06/24/16 1402  BP: (!) 152/90  Pulse: 71  Resp: 14  Temp: 98.7 F (37.1 C)  TempSrc: Oral  SpO2: 98%  Weight: 169 lb (76.7 kg)  Height: 6' (1.829 m)      Assessment & Plan:

## 2016-06-24 NOTE — Progress Notes (Signed)
Pre visit review using our clinic review tool, if applicable. No additional management support is needed unless otherwise documented below in the visit note. 

## 2016-06-24 NOTE — Patient Instructions (Signed)
We do not need labs today and will not change the medicines.

## 2016-06-25 DIAGNOSIS — R42 Dizziness and giddiness: Secondary | ICD-10-CM | POA: Insufficient documentation

## 2016-06-25 NOTE — Assessment & Plan Note (Signed)
Likely some from volume depletion. He has not had recurrent symptoms since that time. Advised to use boost or ensure or carnation supplement to help with calories and hydration and he will take water outside with him and drink throughout the day.

## 2017-04-10 DIAGNOSIS — H524 Presbyopia: Secondary | ICD-10-CM | POA: Diagnosis not present

## 2017-06-25 DIAGNOSIS — N200 Calculus of kidney: Secondary | ICD-10-CM | POA: Diagnosis not present

## 2017-06-25 DIAGNOSIS — N401 Enlarged prostate with lower urinary tract symptoms: Secondary | ICD-10-CM | POA: Diagnosis not present

## 2017-06-25 DIAGNOSIS — R3121 Asymptomatic microscopic hematuria: Secondary | ICD-10-CM | POA: Diagnosis not present

## 2017-06-25 DIAGNOSIS — R351 Nocturia: Secondary | ICD-10-CM | POA: Diagnosis not present

## 2017-10-21 DIAGNOSIS — H1132 Conjunctival hemorrhage, left eye: Secondary | ICD-10-CM | POA: Diagnosis not present

## 2018-03-04 ENCOUNTER — Telehealth: Payer: Self-pay

## 2018-03-04 ENCOUNTER — Ambulatory Visit (INDEPENDENT_AMBULATORY_CARE_PROVIDER_SITE_OTHER): Payer: Medicare Other | Admitting: Internal Medicine

## 2018-03-04 ENCOUNTER — Other Ambulatory Visit (INDEPENDENT_AMBULATORY_CARE_PROVIDER_SITE_OTHER): Payer: Medicare Other

## 2018-03-04 ENCOUNTER — Encounter: Payer: Self-pay | Admitting: Internal Medicine

## 2018-03-04 VITALS — BP 160/88 | HR 54 | Temp 97.9°F | Ht 72.0 in | Wt 175.0 lb

## 2018-03-04 DIAGNOSIS — Z0000 Encounter for general adult medical examination without abnormal findings: Secondary | ICD-10-CM | POA: Diagnosis not present

## 2018-03-04 DIAGNOSIS — Z23 Encounter for immunization: Secondary | ICD-10-CM

## 2018-03-04 LAB — COMPREHENSIVE METABOLIC PANEL
ALBUMIN: 4.5 g/dL (ref 3.5–5.2)
ALK PHOS: 47 U/L (ref 39–117)
ALT: 12 U/L (ref 0–53)
AST: 25 U/L (ref 0–37)
BILIRUBIN TOTAL: 0.8 mg/dL (ref 0.2–1.2)
BUN: 15 mg/dL (ref 6–23)
CO2: 28 mEq/L (ref 19–32)
Calcium: 9.7 mg/dL (ref 8.4–10.5)
Chloride: 103 mEq/L (ref 96–112)
Creatinine, Ser: 1.26 mg/dL (ref 0.40–1.50)
GFR: 68.71 mL/min (ref >60.00)
Glucose, Bld: 91 mg/dL (ref 70–99)
POTASSIUM: 4.4 meq/L (ref 3.5–5.1)
Sodium: 139 mEq/L (ref 135–145)
TOTAL PROTEIN: 7.3 g/dL (ref 6.0–8.3)

## 2018-03-04 LAB — LIPID PANEL
CHOLESTEROL: 210 mg/dL — AB (ref 0–200)
HDL: 76.3 mg/dL (ref >39.00)
LDL Cholesterol: 124 mg/dL — ABNORMAL HIGH (ref 0–99)
NonHDL: 134.12
Total CHOL/HDL Ratio: 3
Triglycerides: 49 mg/dL (ref 0.0–149.0)
VLDL: 9.8 mg/dL (ref 0.0–40.0)

## 2018-03-04 LAB — CBC
HEMATOCRIT: 40.2 % (ref 39.0–52.0)
HEMOGLOBIN: 13.2 g/dL (ref 13.0–17.0)
MCHC: 32.8 g/dL (ref 30.0–36.0)
MCV: 91.1 fl (ref 78.0–100.0)
Platelets: 145 10*3/uL — ABNORMAL LOW (ref 150.0–400.0)
RBC: 4.41 Mil/uL (ref 4.22–5.81)
RDW: 14.7 % (ref 11.5–15.5)
WBC: 21.1 10*3/uL (ref 4.0–10.5)

## 2018-03-04 NOTE — Addendum Note (Signed)
Addended by: Raford Pitcher R on: 03/04/2018 11:09 AM   Modules accepted: Orders

## 2018-03-04 NOTE — Assessment & Plan Note (Signed)
Flu shot given. Pneumonia complete. Shingrix counseled. Tetanus up to date. Colonoscopy aged out. Counseled about sun safety and mole surveillance. Counseled about the dangers of distracted driving. Given 10 year screening recommendations.

## 2018-03-04 NOTE — Telephone Encounter (Signed)
CRITICAL VALUE STICKER  CRITICAL VALUE: WBC 21.1  RECEIVER (on-site recipient of call): [place] NOTIFIED: 3:00  MESSENGER (representative from lab):  MD NOTIFIED:   TIME OF NOTIFICATION:  RESPONSE:

## 2018-03-04 NOTE — Patient Instructions (Signed)
Keep up the good work by staying active. We are checking the blood work today and will call you back about the results.    Health Maintenance, Male A healthy lifestyle and preventive care is important for your health and wellness. Ask your health care provider about what schedule of regular examinations is right for you. What should I know about weight and diet? Eat a Healthy Diet  Eat plenty of vegetables, fruits, whole grains, low-fat dairy products, and lean protein.  Do not eat a lot of foods high in solid fats, added sugars, or salt.  Maintain a Healthy Weight Regular exercise can help you achieve or maintain a healthy weight. You should:  Do at least 150 minutes of exercise each week. The exercise should increase your heart rate and make you sweat (moderate-intensity exercise).  Do strength-training exercises at least twice a week.  Watch Your Levels of Cholesterol and Blood Lipids  Have your blood tested for lipids and cholesterol every 5 years starting at 82 years of age. If you are at high risk for heart disease, you should start having your blood tested when you are 82 years old. You may need to have your cholesterol levels checked more often if: ? Your lipid or cholesterol levels are high. ? You are older than 82 years of age. ? You are at high risk for heart disease.  What should I know about cancer screening? Many types of cancers can be detected early and may often be prevented. Lung Cancer  You should be screened every year for lung cancer if: ? You are a current smoker who has smoked for at least 30 years. ? You are a former smoker who has quit within the past 15 years.  Talk to your health care provider about your screening options, when you should start screening, and how often you should be screened.  Colorectal Cancer  Routine colorectal cancer screening usually begins at 82 years of age and should be repeated every 5-10 years until you are 82 years old. You  may need to be screened more often if early forms of precancerous polyps or small growths are found. Your health care provider may recommend screening at an earlier age if you have risk factors for colon cancer.  Your health care provider may recommend using home test kits to check for hidden blood in the stool.  A small camera at the end of a tube can be used to examine your colon (sigmoidoscopy or colonoscopy). This checks for the earliest forms of colorectal cancer.  Prostate and Testicular Cancer  Depending on your age and overall health, your health care provider may do certain tests to screen for prostate and testicular cancer.  Talk to your health care provider about any symptoms or concerns you have about testicular or prostate cancer.  Skin Cancer  Check your skin from head to toe regularly.  Tell your health care provider about any new moles or changes in moles, especially if: ? There is a change in a mole's size, shape, or color. ? You have a mole that is larger than a pencil eraser.  Always use sunscreen. Apply sunscreen liberally and repeat throughout the day.  Protect yourself by wearing long sleeves, pants, a wide-brimmed hat, and sunglasses when outside.  What should I know about heart disease, diabetes, and high blood pressure?  If you are 68-32 years of age, have your blood pressure checked every 3-5 years. If you are 3 years of age or older,  have your blood pressure checked every year. You should have your blood pressure measured twice-once when you are at a hospital or clinic, and once when you are not at a hospital or clinic. Record the average of the two measurements. To check your blood pressure when you are not at a hospital or clinic, you can use: ? An automated blood pressure machine at a pharmacy. ? A home blood pressure monitor.  Talk to your health care provider about your target blood pressure.  If you are between 48-48 years old, ask your health care  provider if you should take aspirin to prevent heart disease.  Have regular diabetes screenings by checking your fasting blood sugar level. ? If you are at a normal weight and have a low risk for diabetes, have this test once every three years after the age of 61. ? If you are overweight and have a high risk for diabetes, consider being tested at a younger age or more often.  A one-time screening for abdominal aortic aneurysm (AAA) by ultrasound is recommended for men aged 69-75 years who are current or former smokers. What should I know about preventing infection? Hepatitis B If you have a higher risk for hepatitis B, you should be screened for this virus. Talk with your health care provider to find out if you are at risk for hepatitis B infection. Hepatitis C Blood testing is recommended for:  Everyone born from [birth year] through 1963.  Anyone with known risk factors for hepatitis C.  Sexually Transmitted Diseases (STDs)  You should be screened each year for STDs including gonorrhea and chlamydia if: ? You are sexually active and are younger than 82 years of age. ? You are older than 82 years of age and your health care provider tells you that you are at risk for this type of infection. ? Your sexual activity has changed since you were last screened and you are at an increased risk for chlamydia or gonorrhea. Ask your health care provider if you are at risk.  Talk with your health care provider about whether you are at high risk of being infected with HIV. Your health care provider may recommend a prescription medicine to help prevent HIV infection.  What else can I do?  Schedule regular health, dental, and eye exams.  Stay current with your vaccines (immunizations).  Do not use any tobacco products, such as cigarettes, chewing tobacco, and e-cigarettes. If you need help quitting, ask your health care provider.  Limit alcohol intake to no more than 2 drinks per day. One drink equals 12  ounces of beer, 5 ounces of wine, or 1 ounces of hard liquor.  Do not use street drugs.  Do not share needles.  Ask your health care provider for help if you need support or information about quitting drugs.  Tell your health care provider if you often feel depressed.  Tell your health care provider if you have ever been abused or do not feel safe at home. This information is not intended to replace advice given to you by your health care provider. Make sure you discuss any questions you have with your health care provider. Document Released: 09/14/2007 Document Revised: 11/15/2015 Document Reviewed: 12/20/2014 Elsevier Interactive Patient Education  [employer].

## 2018-03-04 NOTE — Progress Notes (Signed)
   Subjective:    Patient ID: Bradley Meza, male    DOB: May 19, 1923, 84 y.o.   MRN: [id number]  HPI Here for medicare wellness and physical, no new complaints. Please see A/P for status and treatment of chronic medical problems.   Diet: heart healthy Physical activity: sedentary, active in the yard most days Depression/mood screen: negative Hearing: intact to whispered voice Visual acuity: grossly normal, performs annual eye exam  ADLs: capable Fall risk: low Home safety: good Cognitive evaluation: intact to orientation, naming, recall and repetition EOL planning: adv directives discussed  I have personally reviewed and have noted 1. The patient's medical and social history - reviewed today no changes 2. Their use of alcohol, tobacco or illicit drugs 3. Their current medications and supplements 4. The patient's functional ability including ADL's, fall risks, home safety risks and hearing or visual impairment. 5. Diet and physical activities 6. Evidence for depression or mood disorders 7. Care team reviewed and updated (available in snapshot)   Review of Systems  Constitutional: Negative.   HENT: Negative.   Eyes: Negative.   Respiratory: Negative for cough, chest tightness and shortness of breath.   Cardiovascular: Negative for chest pain, palpitations and leg swelling.  Gastrointestinal: Negative for abdominal distention, abdominal pain, constipation, diarrhea, nausea and vomiting.  Musculoskeletal: Negative.   Skin: Negative.   Neurological: Negative.   Psychiatric/Behavioral: Negative.       Objective:   Physical Exam  Constitutional: He is oriented to person, place, and time. He appears well-developed and well-nourished.  Looks younger than age  HENT:  Head: Normocephalic and atraumatic.  Eyes: EOM are normal.  Neck: Normal range of motion.  Cardiovascular: Normal rate and regular rhythm.  Pulmonary/Chest: Effort normal and breath sounds normal. No respiratory  distress. He has no wheezes. He has no rales.  Abdominal: Soft. Bowel sounds are normal. He exhibits no distension. There is no tenderness. There is no rebound.  Musculoskeletal: He exhibits no edema.  Neurological: He is alert and oriented to person, place, and time. Coordination normal.  Skin: Skin is warm and dry.  Psychiatric: He has a normal mood and affect.   Vitals:   03/04/18 1017  BP: (!) 160/88  Pulse: (!) 54  Temp: 97.9 F (36.6 C)  TempSrc: Oral  SpO2: 97%  Weight: 175 lb (79.4 kg)  Height: 6' (1.829 m)      Assessment & Plan:  Flu shot given at visit

## 2018-03-05 ENCOUNTER — Other Ambulatory Visit: Payer: Self-pay | Admitting: Internal Medicine

## 2018-03-05 ENCOUNTER — Other Ambulatory Visit (INDEPENDENT_AMBULATORY_CARE_PROVIDER_SITE_OTHER): Payer: Medicare Other

## 2018-03-05 DIAGNOSIS — D72829 Elevated white blood cell count, unspecified: Secondary | ICD-10-CM

## 2018-03-05 LAB — WHITE CELL DIFFERENTIAL
BASOS PCT: 0.6 % (ref 0.0–3.0)
Band Neutrophils: 0 %
EOS PCT: 0.5 % (ref 0.0–5.0)
Lymphocytes Relative: 81.1 % — ABNORMAL HIGH (ref 12.0–46.0)
Monocytes Relative: 2.2 % — ABNORMAL LOW (ref 3.0–12.0)
Neutrophils Relative %: 15.6 % — ABNORMAL LOW (ref 43.0–77.0)

## 2018-03-05 NOTE — Telephone Encounter (Signed)
Noted, will address via result note 

## 2018-03-06 ENCOUNTER — Other Ambulatory Visit: Payer: Self-pay | Admitting: Internal Medicine

## 2018-03-06 DIAGNOSIS — D72829 Elevated white blood cell count, unspecified: Secondary | ICD-10-CM

## 2018-03-06 LAB — PATHOLOGIST SMEAR REVIEW

## 2018-03-17 ENCOUNTER — Telehealth: Payer: Self-pay | Admitting: Internal Medicine

## 2018-03-17 ENCOUNTER — Encounter: Payer: Self-pay | Admitting: Internal Medicine

## 2018-03-17 NOTE — Telephone Encounter (Signed)
New referral received from Dr. Sharlet Salina for leukocytosis. Bradley Meza has been cld and scheduled to see Dr. Walden Field on 04/10/18 at 1050am. Letter mailed to the Bradley Meza and msg fwd to referring office to notify the Bradley Meza.

## 2018-04-10 ENCOUNTER — Inpatient Hospital Stay: Payer: Medicare Other | Attending: Internal Medicine | Admitting: Internal Medicine

## 2018-04-10 ENCOUNTER — Encounter: Payer: Self-pay | Admitting: Internal Medicine

## 2018-04-10 ENCOUNTER — Telehealth: Payer: Self-pay

## 2018-04-10 ENCOUNTER — Other Ambulatory Visit: Payer: Self-pay

## 2018-04-10 ENCOUNTER — Inpatient Hospital Stay: Payer: Medicare Other

## 2018-04-10 VITALS — BP 159/81 | HR 70 | Temp 96.8°F | Resp 17 | Ht 72.0 in | Wt 179.0 lb

## 2018-04-10 DIAGNOSIS — C61 Malignant neoplasm of prostate: Secondary | ICD-10-CM | POA: Insufficient documentation

## 2018-04-10 DIAGNOSIS — D72820 Lymphocytosis (symptomatic): Secondary | ICD-10-CM | POA: Diagnosis not present

## 2018-04-10 DIAGNOSIS — D649 Anemia, unspecified: Secondary | ICD-10-CM | POA: Diagnosis not present

## 2018-04-10 DIAGNOSIS — L988 Other specified disorders of the skin and subcutaneous tissue: Secondary | ICD-10-CM | POA: Diagnosis not present

## 2018-04-10 DIAGNOSIS — Z8546 Personal history of malignant neoplasm of prostate: Secondary | ICD-10-CM

## 2018-04-10 DIAGNOSIS — E7800 Pure hypercholesterolemia, unspecified: Secondary | ICD-10-CM | POA: Insufficient documentation

## 2018-04-10 DIAGNOSIS — Z87891 Personal history of nicotine dependence: Secondary | ICD-10-CM | POA: Insufficient documentation

## 2018-04-10 DIAGNOSIS — Z7982 Long term (current) use of aspirin: Secondary | ICD-10-CM

## 2018-04-10 DIAGNOSIS — Z79899 Other long term (current) drug therapy: Secondary | ICD-10-CM | POA: Diagnosis not present

## 2018-04-10 DIAGNOSIS — C9110 Chronic lymphocytic leukemia of B-cell type not having achieved remission: Secondary | ICD-10-CM | POA: Diagnosis not present

## 2018-04-10 LAB — CBC WITH DIFFERENTIAL/PLATELET
Abs Immature Granulocytes: 0 10*3/uL (ref 0.00–0.07)
BASOS PCT: 0 %
Basophils Absolute: 0 10*3/uL (ref 0.0–0.1)
EOS PCT: 1 %
Eosinophils Absolute: 0.3 10*3/uL (ref 0.0–0.5)
HCT: 41.1 % (ref 39.0–52.0)
Hemoglobin: 12.9 g/dL — ABNORMAL LOW (ref 13.0–17.0)
Lymphocytes Relative: 93 %
Lymphs Abs: 24.6 10*3/uL — ABNORMAL HIGH (ref 0.7–4.0)
MCH: 29.9 pg (ref 26.0–34.0)
MCHC: 31.4 g/dL (ref 30.0–36.0)
MCV: 95.1 fL (ref 80.0–100.0)
MONO ABS: 0.3 10*3/uL (ref 0.1–1.0)
Monocytes Relative: 1 %
Neutro Abs: 1.3 10*3/uL — ABNORMAL LOW (ref 1.7–17.7)
Neutrophils Relative %: 5 %
PLATELETS: 128 10*3/uL — AB (ref 150–400)
RBC: 4.32 MIL/uL (ref 4.22–5.81)
RDW: 13.7 % (ref 11.5–15.5)
WBC: 26.5 10*3/uL — ABNORMAL HIGH (ref 4.0–10.5)
nRBC: 0 % (ref 0.0–0.2)

## 2018-04-10 LAB — COMPREHENSIVE METABOLIC PANEL
ALT: 13 U/L (ref 0–44)
ANION GAP: 5 (ref 5–15)
AST: 24 U/L (ref 15–41)
Albumin: 4.2 g/dL (ref 3.5–5.0)
Alkaline Phosphatase: 61 U/L (ref 38–126)
BUN: 14 mg/dL (ref 8–23)
CO2: 29 mmol/L (ref 22–32)
Calcium: 9.5 mg/dL (ref 8.9–10.3)
Chloride: 105 mmol/L (ref 98–111)
Creatinine, Ser: 1.27 mg/dL — ABNORMAL HIGH (ref 0.61–1.24)
GFR calc Af Amer: 56 mL/min — ABNORMAL LOW (ref >60)
GFR, EST NON AFRICAN AMERICAN: 49 mL/min — AB (ref >60)
Glucose, Bld: 86 mg/dL (ref 70–99)
POTASSIUM: 4.7 mmol/L (ref 3.5–5.1)
Sodium: 139 mmol/L (ref 135–145)
Total Bilirubin: 0.8 mg/dL (ref 0.3–1.2)
Total Protein: 7.1 g/dL (ref 6.5–8.1)

## 2018-04-10 LAB — LACTATE DEHYDROGENASE: LDH: 243 U/L — AB (ref 98–192)

## 2018-04-10 LAB — FERRITIN: Ferritin: 59 ng/mL (ref 24–336)

## 2018-04-10 LAB — SEDIMENTATION RATE: Sed Rate: 1 mm/hr (ref 0–16)

## 2018-04-10 NOTE — Progress Notes (Signed)
Referring physician:  [facility].    Diagnosis Lymphocytosis - Plan: CBC with Differential/Platelet, Comprehensive metabolic panel, Lactate dehydrogenase, Ferritin, Sedimentation rate, Flow Cytometry  Staging Cancer Staging No matching staging information was found for the patient.  Assessment and Plan:  1.  Leucocytosis.  85 year old male seen by PCP for physical.  Labs done 03/04/2018 showed WBC 21.1 Hb 13.2 plts 145,000.  Pt had elevated lymphocyte count of 81%.  Chemistries WNL with K+ 4.4 Cr 1.26 and normal LFTs.  Path review of peripheral smear shows lymphocytosis with flow cytometry recommended.  Review of chart shows labs done 06/20/2016 showed WBC 16.3 HB 13.6 plts 152,000.  No differential was performed at that time.  Pt denies smoking.  He denies fevers, chills, night sweats and has noted no adenopathy.  He has an area in right axilla that her reports has been present for 20 years and he was able to express some black material for area.  Last colonoscopy was 06/02/2000 and showed diverticulosis with no polyps.  Pt denies any family history of leukemia or lymphoma.  He is seen today for consultation due to leucocytosis.   Labs repeated today 04/10/2018 and showed WBC 26.5 HB 12.9 plts 128,000.  He has 93% lymphocytes.  Path review consistent with lymphocytosis.  Chemistries WNL with K+ 4.7 Cr 1.27 and normal LFTs.  Suspect CLL. Will send flow cytometry for CLL and lymphoma.  He is asymptomatic with no adenopathy noted on exam.  Pt shows no evidence of anemia or significant thrombocytopenia.  Pt will RTC in 2 weeks to go over lab results.  All questions answered and he expressed understanding of the information presented.    2.  Right axillary lesion.  This appears more consistent with small cyst.  No palpable adenopathy noted on exam.    3.  Prostate cancer.  Pt was followed by DrWrenn- Bx pos in 2009 w/ PSA=6.52.   but it has been lower.  Pt reportedly on  observation.  Follow-up with Dr. Jeffie Pollock as directed.    4.  Health maintenance.  Last colonoscopy was in 2002.  Follow-up with PCP as directed.    40 minutes spent with more than 50% spent in review of chart, counseling and coordination of care.    HPI:  85 year old male seen by PCP for physical.  Labs done 03/04/2018 showed WBC 21.1 Hb 13.2 plts 145,000.  Pt had elevated lymphocyte count of 81%.  Chemistries WNL with K+ 4.4 Cr 1.26 and normal LFTs.  Path review of peripheral smear shows lymphocytosis with flow cytometry recommended.  Pt denies smoking.  He denies fevers, chills, night sweats and has noted no adenopathy.  He has an area in right axilla that her reports has been present for 20 years and he was able to express some black material for area.  Last colonoscopy was 06/02/2000 and showed diverticulosis with no polyps.  Pt denies any family history of leukemia or lymphoma.  He is seen today for consultation due to leucocytosis.   Problem List Patient Active Problem List   Diagnosis Date Noted  . Routine general medical examination at a health care facility [Z00.00] 03/14/2015  . Kidney stone [N20.0] 08/09/2013  . PROSTATE CANCER [C61] 05/18/2009    Past Medical History Past Medical History:  Diagnosis Date  . Allergy history unknown   . Blood donor, other   . COPD (chronic obstructive pulmonary disease) ([place])   . Diverticulosis of colon   .  DJD (degenerative joint disease)   . GERD (gastroesophageal reflux disease)   . Hypercholesteremia   . Lumbar back pain   . Prostate cancer ([place])   . Renal cyst     Past Surgical History Past Surgical History:  Procedure Laterality Date  . RIH repair      Family History Family History  Problem Relation Age of Onset  . Arthritis Mother      Social History  reports that he quit smoking about 30 years ago. His smoking use included cigarettes. He has quit using smokeless tobacco. He reports current alcohol  use.  Medications  Current Outpatient Medications:  .  aspirin 81 MG tablet, Take 81 mg by mouth daily., Disp: , Rfl:  .  cholecalciferol (VITAMIN D) 1000 UNITS tablet, Take 1,000 Units by mouth daily., Disp: , Rfl:  .  Multiple Vitamins-Minerals (CENTRUM SILVER PO), Take 1 tablet by mouth daily., Disp: , Rfl:   Allergies Patient has no known allergies.  Review of Systems Review of Systems - Oncology ROS negative   Physical Exam  Vitals Wt Readings from Last 3 Encounters:  04/10/18 179 lb (81.2 kg)  03/04/18 175 lb (79.4 kg)  06/24/16 169 lb (76.7 kg)   Temp Readings from Last 3 Encounters:  04/10/18 (!) 96.8 F (36 C) (Oral)  03/04/18 97.9 F (36.6 C) (Oral)  06/24/16 98.7 F (37.1 C) (Oral)   BP Readings from Last 3 Encounters:  04/10/18 (!) 159/81  03/04/18 (!) 160/88  06/24/16 (!) 152/90   Pulse Readings from Last 3 Encounters:  04/10/18 70  03/04/18 (!) 54  06/24/16 71   Constitutional: Well-developed, well-nourished, and in no distress.  Appears younger than stated age.   HENT: Head: Normocephalic and atraumatic.  Mouth/Throat: No oropharyngeal exudate. Mucosa moist. Eyes: Pupils are equal, round, and reactive to light. Conjunctivae are normal. No scleral icterus.  Neck: Normal range of motion. Neck supple. No JVD present.  Cardiovascular: Normal rate, regular rhythm and normal heart sounds.  Exam reveals no gallop and no friction rub.   No murmur heard. Pulmonary/Chest: Effort normal and breath sounds normal. No respiratory distress. No wheezes.No rales.  Abdominal: Soft. Bowel sounds are normal. No distension. There is no tenderness. There is no guarding.  Musculoskeletal: No edema or tenderness.  Lymphadenopathy: No cervical,axillary or supraclavicular adenopathy. Area in right axilla likely small cyst.   Neurological: Alert and oriented to person, place, and time. No cranial nerve deficit.  Skin: Skin is warm and dry. No rash noted. No erythema. No  pallor.  Psychiatric: Affect and judgment normal.   Labs Appointment on 04/10/2018  Component Date Value Ref Range Status  . WBC 04/10/2018 26.5* 4.0 - 10.5 K/uL Final  . RBC 04/10/2018 4.32  4.22 - 5.81 MIL/uL Final  . Hemoglobin 04/10/2018 12.9* 13.0 - 17.0 g/dL Final  . HCT 04/10/2018 41.1  39.0 - 52.0 % Final  . MCV 04/10/2018 95.1  80.0 - 100.0 fL Final  . MCH 04/10/2018 29.9  26.0 - 34.0 pg Final  . MCHC 04/10/2018 31.4  30.0 - 36.0 g/dL Final  . RDW 04/10/2018 13.7  11.5 - 15.5 % Final  . Platelets 04/10/2018 128* 150 - 400 K/uL Final  . nRBC 04/10/2018 0.0  0.0 - 0.2 % Final  . Neutrophils Relative % 04/10/2018 5  % Final  . Neutro Abs 04/10/2018 1.3* 1.7 - 17.7 K/uL Final  . Lymphocytes Relative 04/10/2018 93  % Final  . Lymphs Abs 04/10/2018 24.6* 0.7 -  4.0 K/uL Final  . Monocytes Relative 04/10/2018 1  % Final  . Monocytes Absolute 04/10/2018 0.3  0.1 - 1.0 K/uL Final  . Eosinophils Relative 04/10/2018 1  % Final  . Eosinophils Absolute 04/10/2018 0.3  0.0 - 0.5 K/uL Final  . Basophils Relative 04/10/2018 0  % Final  . Basophils Absolute 04/10/2018 0.0  0.0 - 0.1 K/uL Final  . Smear Review 04/10/2018 ATYPICAL LYMPHS   Final  . Abs Immature Granulocytes 04/10/2018 0.00  0.00 - 0.07 K/uL Final  . Smudge Cells 04/10/2018 PRESENT   Final   Performed at [facility], [place] [address], [place], [place] [address]  . Sodium 04/10/2018 139  135 - 145 mmol/L Final  . Potassium 04/10/2018 4.7  3.5 - 5.1 mmol/L Final  . Chloride 04/10/2018 105  98 - 111 mmol/L Final  . CO2 04/10/2018 29  22 - 32 mmol/L Final  . Glucose, Bld 04/10/2018 86  70 - 99 mg/dL Final  . BUN 04/10/2018 14  8 - 23 mg/dL Final  . Creatinine, Ser 04/10/2018 1.27* 0.61 - 1.24 mg/dL Final  . Calcium 04/10/2018 9.5  8.9 - 10.3 mg/dL Final  . Total Protein 04/10/2018 7.1  6.5 - 8.1 g/dL Final  . Albumin 04/10/2018 4.2  3.5 - 5.0 g/dL Final  . AST 04/10/2018 24  15 - 41 U/L Final  . ALT  04/10/2018 13  0 - 44 U/L Final  . Alkaline Phosphatase 04/10/2018 61  38 - 126 U/L Final  . Total Bilirubin 04/10/2018 0.8  0.3 - 1.2 mg/dL Final  . GFR calc non Af Amer 04/10/2018 49* >60 mL/min Final  . GFR calc Af Amer 04/10/2018 56* >60 mL/min Final  . Anion gap 04/10/2018 5  5 - 15 Final   Performed at [facility], [place] [address] [place], [place] [address]  . LDH 04/10/2018 243* 98 - 192 U/L Final   Performed at [facility], [place] [address], [place], [place] [address]     Pathology Orders Placed This Encounter  Procedures  . CBC with Differential/Platelet    Standing Status:   Future    Number of Occurrences:   1    Standing Expiration Date:   04/11/2019  . Comprehensive metabolic panel    Standing Status:   Future    Number of Occurrences:   1    Standing Expiration Date:   04/11/2019  . Lactate dehydrogenase    Standing Status:   Future    Number of Occurrences:   1    Standing Expiration Date:   04/11/2019  . Ferritin    Standing Status:   Future    Number of Occurrences:   1    Standing Expiration Date:   04/11/2019  . Sedimentation rate    Standing Status:   Future    Number of Occurrences:   1    Standing Expiration Date:   04/11/2019  . Flow Cytometry    CLL panel Lymphoma panel    Standing Status:   Future    Number of Occurrences:   1    Standing Expiration Date:   04/11/2019       Zoila Shutter MD

## 2018-04-10 NOTE — Telephone Encounter (Signed)
Printed avs and calender of upcoming appointment. Per 1/10 los 

## 2018-04-13 LAB — FLOW CYTOMETRY

## 2018-04-24 ENCOUNTER — Inpatient Hospital Stay: Payer: Medicare Other | Admitting: Internal Medicine

## 2018-04-24 ENCOUNTER — Telehealth: Payer: Self-pay | Admitting: Internal Medicine

## 2018-04-24 ENCOUNTER — Inpatient Hospital Stay: Payer: Medicare Other

## 2018-04-24 VITALS — BP 171/77 | HR 68 | Temp 97.8°F | Resp 17 | Ht 72.0 in | Wt 174.9 lb

## 2018-04-24 DIAGNOSIS — Z79899 Other long term (current) drug therapy: Secondary | ICD-10-CM | POA: Diagnosis not present

## 2018-04-24 DIAGNOSIS — C61 Malignant neoplasm of prostate: Secondary | ICD-10-CM | POA: Diagnosis not present

## 2018-04-24 DIAGNOSIS — D72820 Lymphocytosis (symptomatic): Secondary | ICD-10-CM

## 2018-04-24 DIAGNOSIS — D649 Anemia, unspecified: Secondary | ICD-10-CM | POA: Diagnosis not present

## 2018-04-24 DIAGNOSIS — C9110 Chronic lymphocytic leukemia of B-cell type not having achieved remission: Secondary | ICD-10-CM

## 2018-04-24 DIAGNOSIS — Z87891 Personal history of nicotine dependence: Secondary | ICD-10-CM | POA: Diagnosis not present

## 2018-04-24 DIAGNOSIS — Z7982 Long term (current) use of aspirin: Secondary | ICD-10-CM | POA: Diagnosis not present

## 2018-04-24 DIAGNOSIS — E7800 Pure hypercholesterolemia, unspecified: Secondary | ICD-10-CM | POA: Diagnosis not present

## 2018-04-24 NOTE — Telephone Encounter (Signed)
Gave AVS and calendar °

## 2018-04-24 NOTE — Progress Notes (Signed)
Diagnosis Lymphocytosis - Plan: Hepatitis B core antibody, total, Hepatitis B surface antibody,qualitative, Hepatitis B surface antigen, Hepatitis C RNA quantitative, Hepatitis panel, acute, HIV Antibody (routine testing w rflx), FISH Oncology, CBC with Differential/Platelet, Comprehensive metabolic panel, Lactate dehydrogenase, Uric acid  Staging   Cancer Staging No matching staging information was found for the patient.  Assessment and Plan:  1.  Stage 0 CLL.  85 year old male seen by PCP for physical.  Labs done 03/04/2018 showed WBC 21.1 Hb 13.2 plts 145,000.  Pt had elevated lymphocyte count of 81%.  Chemistries WNL with K+ 4.4 Cr 1.26 and normal LFTs.  Path review of peripheral smear shows lymphocytosis with flow cytometry recommended.  Review of chart showed  labs done 06/20/2016 showed WBC 16.3 HB 13.6 plts 152,000.  No differential was performed at that time.  Pt denies smoking.  He denies fevers, chills, night sweats and has noted no adenopathy.  He has an area in right axilla that her reports has been present for 20 years and he was able to express some black material for area.  Last colonoscopy was 06/02/2000 and showed diverticulosis with no polyps.  Pt denies any family history of leukemia or lymphoma.  He is seen today for consultation due to leucocytosis.   Labs repeated 04/10/2018 and showed WBC 26.5 HB 12.9 plts 128,000.  He has 93% lymphocytes.  Path review consistent with lymphocytosis.  Chemistries WNL with K+ 4.7 Cr 1.27 and normal LFTs.    Peripheral blood for flow cytometry was done 04/10/2018 with results returning as Interpretation Peripheral Blood Flow Cytometry - CD5 POSITIVE MONOCLONAL B CELL POPULATION COMPRISES 93% OF ALL LYMPHOCYTES - SEE COMMENT Diagnosis Comment: Examination of the peripheral blood reveals a lymphocytosis with frequent small lymphocytes that have clumped chromatin and scant cytoplasm. Larger cells with prominent nucleoli are not identified. Normocytic  anemia and thrombocytopenia are also noted. Overall, the morphology and immunophenotype are most consistent with chronic lymphocytic leukemia. FISH studies for t(11;14) may be helpful to formally exclude an atypical mantle cell lymphoma  Pt remains asymptomatic with no adenopathy noted on exam.  Pt shows no evidence of anemia or significant thrombocytopenia.    Natural history of early stage CLL discussed with pt.  He was provided written NCCN pt information guidelines regarding diagnosis.    Will send Hepatitis panel and HIV testing as well as [place] for T 11;14.  If studies are negative pt will remain on observation with repeat labs in 07/2018.  He will follow-up at that time to go over results.  All questions answered and he expressed understanding of the information presented.    2.  Right axillary lesion.  This appears more consistent with small cyst.  No palpable adenopathy noted on exam.    3.  Prostate cancer.  Pt was followed by DrWrenn- Bx pos in 2009 w/ PSA=6.52.   Pt reportedly on observation.  Follow-up with Dr. Jeffie Pollock as directed.    4.  Health maintenance.  Last colonoscopy was in 2002.  Follow-up with PCP as directed.    25 minutes spent with more than 50% spent in counseling and coordination of care.     Interval History:  Historical data obtained from note dated 04/10/2018:  85 year old male seen by PCP for physical.  Labs done 03/04/2018 showed WBC 21.1 Hb 13.2 plts 145,000.  Pt had elevated lymphocyte count of 81%.  Chemistries WNL with K+ 4.4 Cr 1.26 and normal LFTs.  Path review of peripheral  smear shows lymphocytosis with flow cytometry recommended.  Pt denies smoking.  He denies fevers, chills, night sweats and has noted no adenopathy.  He has an area in right axilla that her reports has been present for 20 years and he was able to express some black material for area.  Last colonoscopy was 06/02/2000 and showed diverticulosis with no polyps.  Pt denies any family history of  leukemia or lymphoma.  Pt initially seen for  consultation due to leucocytosis.   Current Status:  Pt is seen today for follow-up.  He is here to go over labs.  He denies any fevers, chills, night sweats, adenopathy.  Problem List Patient Active Problem List   Diagnosis Date Noted  . Routine general medical examination at a health care facility [Z00.00] 03/14/2015  . Kidney stone [N20.0] 08/09/2013  . PROSTATE CANCER [C61] 05/18/2009    Past Medical History Past Medical History:  Diagnosis Date  . Allergy history unknown   . Blood donor, other   . COPD (chronic obstructive pulmonary disease) ([place])   . Diverticulosis of colon   . DJD (degenerative joint disease)   . GERD (gastroesophageal reflux disease)   . Hypercholesteremia   . Lumbar back pain   . Prostate cancer ([place])   . Renal cyst     Past Surgical History Past Surgical History:  Procedure Laterality Date  . RIH repair      Family History Family History  Problem Relation Age of Onset  . Arthritis Mother      Social History  reports that he quit smoking about 30 years ago. His smoking use included cigarettes. He has quit using smokeless tobacco. He reports current alcohol use.  Medications  Current Outpatient Medications:  .  aspirin 81 MG tablet, Take 81 mg by mouth daily., Disp: , Rfl:  .  cholecalciferol (VITAMIN D) 1000 UNITS tablet, Take 1,000 Units by mouth daily., Disp: , Rfl:  .  Multiple Vitamins-Minerals (CENTRUM SILVER PO), Take 1 tablet by mouth daily., Disp: , Rfl:   Allergies Patient has no known allergies.  Review of Systems Review of Systems - Oncology ROS negative   Physical Exam  Vitals Wt Readings from Last 3 Encounters:  04/24/18 174 lb 14.4 oz (79.3 kg)  04/10/18 179 lb (81.2 kg)  03/04/18 175 lb (79.4 kg)   Temp Readings from Last 3 Encounters:  04/24/18 97.8 F (36.6 C) (Oral)  04/10/18 (!) 96.8 F (36 C) (Oral)  03/04/18 97.9 F (36.6 C) (Oral)   BP Readings from  Last 3 Encounters:  04/24/18 (!) 171/77  04/10/18 (!) 159/81  03/04/18 (!) 160/88   Pulse Readings from Last 3 Encounters:  04/24/18 68  04/10/18 70  03/04/18 (!) 54   Constitutional: Well-developed, well-nourished, and in no distress.   HENT: Head: Normocephalic and atraumatic.  Mouth/Throat: No oropharyngeal exudate. Mucosa moist. Eyes: Pupils are equal, round, and reactive to light. Conjunctivae are normal. No scleral icterus.  Neck: Normal range of motion. Neck supple. No JVD present.  Cardiovascular: Normal rate, regular rhythm and normal heart sounds.  Exam reveals no gallop and no friction rub.   No murmur heard. Pulmonary/Chest: Effort normal and breath sounds normal. No respiratory distress. No wheezes.No rales.  Abdominal: Soft. Bowel sounds are normal. No distension. There is no tenderness. There is no guarding.  Musculoskeletal: No edema or tenderness.  Lymphadenopathy: No cervical, axillary or supraclavicular adenopathy. Right axillary lesion consistent with cyst.   Neurological: Alert and oriented to person, place,  and time. No cranial nerve deficit.  Skin: Skin is warm and dry. No rash noted. No erythema. No pallor.  Psychiatric: Affect and judgment normal.   Labs No visits with results within 3 Day(s) from this visit.  Latest known visit with results is:  Clinical Support on 04/10/2018  Component Date Value Ref Range Status  . WBC 04/10/2018 26.5* 4.0 - 10.5 K/uL Final  . RBC 04/10/2018 4.32  4.22 - 5.81 MIL/uL Final  . Hemoglobin 04/10/2018 12.9* 13.0 - 17.0 g/dL Final  . HCT 04/10/2018 41.1  39.0 - 52.0 % Final  . MCV 04/10/2018 95.1  80.0 - 100.0 fL Final  . MCH 04/10/2018 29.9  26.0 - 34.0 pg Final  . MCHC 04/10/2018 31.4  30.0 - 36.0 g/dL Final  . RDW 04/10/2018 13.7  11.5 - 15.5 % Final  . Platelets 04/10/2018 128* 150 - 400 K/uL Final  . nRBC 04/10/2018 0.0  0.0 - 0.2 % Final  . Neutrophils Relative % 04/10/2018 5  % Final  . Neutro Abs 04/10/2018 1.3*  1.7 - 17.7 K/uL Final  . Lymphocytes Relative 04/10/2018 93  % Final  . Lymphs Abs 04/10/2018 24.6* 0.7 - 4.0 K/uL Final  . Monocytes Relative 04/10/2018 1  % Final  . Monocytes Absolute 04/10/2018 0.3  0.1 - 1.0 K/uL Final  . Eosinophils Relative 04/10/2018 1  % Final  . Eosinophils Absolute 04/10/2018 0.3  0.0 - 0.5 K/uL Final  . Basophils Relative 04/10/2018 0  % Final  . Basophils Absolute 04/10/2018 0.0  0.0 - 0.1 K/uL Final  . Smear Review 04/10/2018 ATYPICAL LYMPHS   Final  . Abs Immature Granulocytes 04/10/2018 0.00  0.00 - 0.07 K/uL Final  . Smudge Cells 04/10/2018 PRESENT   Final   Performed at [facility], [place] [address], [place], [place] [address]  . Flow Cytometry 04/10/2018 See Scanned report in [place]   Final   Performed at [facility], [address], [place], [place] [address]  . Sed Rate 04/10/2018 1  0 - 16 mm/hr Final   Performed at [facility], [place] [address], [place], [place] [address]  . Sodium 04/10/2018 139  135 - 145 mmol/L Final  . Potassium 04/10/2018 4.7  3.5 - 5.1 mmol/L Final  . Chloride 04/10/2018 105  98 - 111 mmol/L Final  . CO2 04/10/2018 29  22 - 32 mmol/L Final  . Glucose, Bld 04/10/2018 86  70 - 99 mg/dL Final  . BUN 04/10/2018 14  8 - 23 mg/dL Final  . Creatinine, Ser 04/10/2018 1.27* 0.61 - 1.24 mg/dL Final  . Calcium 04/10/2018 9.5  8.9 - 10.3 mg/dL Final  . Total Protein 04/10/2018 7.1  6.5 - 8.1 g/dL Final  . Albumin 04/10/2018 4.2  3.5 - 5.0 g/dL Final  . AST 04/10/2018 24  15 - 41 U/L Final  . ALT 04/10/2018 13  0 - 44 U/L Final  . Alkaline Phosphatase 04/10/2018 61  38 - 126 U/L Final  . Total Bilirubin 04/10/2018 0.8  0.3 - 1.2 mg/dL Final  . GFR calc non Af Amer 04/10/2018 49* >60 mL/min Final  . GFR calc Af Amer 04/10/2018 56* >60 mL/min Final  . Anion gap 04/10/2018 5  5 - 15 Final   Performed at [facility], [place] [address], [place], [place] [address]  . LDH 04/10/2018 243* 98 - 192 U/L Final   Performed at [facility], [place] [address] [place], [place] [address]  . Ferritin 04/10/2018 59  24 - 336 ng/mL Final   Performed at [facility], [place] [address], [place], [place] [address]     Pathology Orders Placed This Encounter  Procedures  . Hepatitis B core antibody, total    Standing Status:   Future    Number of Occurrences:   1    Standing Expiration Date:   04/25/2019  . Hepatitis B surface antibody,qualitative    Standing Status:   Future    Number of Occurrences:   1    Standing Expiration Date:   04/25/2019  . Hepatitis B surface antigen    Standing Status:   Future    Number of Occurrences:   1    Standing Expiration Date:   04/25/2019  . Hepatitis C RNA quantitative    Standing Status:   Future    Number of Occurrences:   1    Standing Expiration Date:   04/25/2019  . Hepatitis panel, acute    Standing Status:   Future    Number of Occurrences:   1    Standing Expiration Date:   04/25/2019  . HIV Antibody (routine testing w rflx)    Standing Status:   Future    Number of Occurrences:   1    Standing Expiration Date:   04/25/2019  . FISH Oncology    Mantle cell  11;14    Standing Status:   Future    Number of Occurrences:   1    Standing Expiration Date:   04/25/2019  . CBC with Differential/Platelet    Standing Status:   Future    Standing Expiration Date:   04/25/2019  . Comprehensive metabolic panel    Standing Status:   Future    Standing Expiration Date:   04/25/2019  . Lactate dehydrogenase    Standing Status:   Future    Standing Expiration Date:   04/25/2019  . Uric acid    Standing Status:   Future    Standing Expiration Date:   04/25/2019       Zoila Shutter MD

## 2018-04-25 LAB — HEPATITIS B CORE ANTIBODY, TOTAL: Hep B Core Total Ab: NEGATIVE

## 2018-04-25 LAB — HEPATITIS PANEL, ACUTE
HCV Ab: <0.1 s/co ratio (ref 0.0–0.9)
Hep A IgM: NEGATIVE
Hep B C IgM: NEGATIVE
Hepatitis B Surface Ag: NEGATIVE

## 2018-04-25 LAB — HEPATITIS B SURFACE ANTIBODY,QUALITATIVE: Hep B S Ab: NONREACTIVE

## 2018-04-25 LAB — HIV ANTIBODY (ROUTINE TESTING W REFLEX): HIV Screen 4th Generation wRfx: NONREACTIVE

## 2018-04-25 LAB — HEPATITIS B SURFACE ANTIGEN: Hepatitis B Surface Ag: NEGATIVE

## 2018-04-25 LAB — HCV RNA QUANT: HCV Quantitative: NOT DETECTED IU/mL (ref >50)

## 2018-04-27 ENCOUNTER — Telehealth: Payer: Self-pay | Admitting: Internal Medicine

## 2018-04-27 NOTE — Telephone Encounter (Signed)
Per 01/24 los.  Appts already scheduled.

## 2018-05-20 LAB — LYMPHOMA-MANTLE CELL

## 2018-08-20 ENCOUNTER — Other Ambulatory Visit: Payer: Medicare Other

## 2018-08-20 ENCOUNTER — Ambulatory Visit: Payer: Medicare Other | Admitting: Internal Medicine

## 2019-03-25 DIAGNOSIS — R410 Disorientation, unspecified: Secondary | ICD-10-CM | POA: Diagnosis not present

## 2019-03-25 DIAGNOSIS — D72829 Elevated white blood cell count, unspecified: Secondary | ICD-10-CM | POA: Diagnosis not present

## 2019-03-26 DIAGNOSIS — R410 Disorientation, unspecified: Secondary | ICD-10-CM | POA: Diagnosis not present

## 2019-03-26 DIAGNOSIS — D72829 Elevated white blood cell count, unspecified: Secondary | ICD-10-CM | POA: Diagnosis not present

## 2019-03-29 ENCOUNTER — Other Ambulatory Visit: Payer: Self-pay

## 2019-03-29 ENCOUNTER — Ambulatory Visit: Payer: Medicare Other | Admitting: Internal Medicine

## 2019-03-29 ENCOUNTER — Encounter: Payer: Self-pay | Admitting: Internal Medicine

## 2019-03-29 NOTE — Progress Notes (Signed)
   Subjective:   Patient ID: Bradley Meza, male    DOB: Nov 18, 1923, 85 y.o.   MRN: [id number]  HPI The patient is a 85 YO man coming in for confusion and strange behavior recently. He did not want to be see and left prior to being seen by provider.   Review of Systems  Objective:  Physical Exam  Vitals:   03/29/19 1556  Weight: 172 lb (78 kg)  Height: 6' (1.829 m)    This visit occurred during the SARS-CoV-2 public health emergency.  Safety protocols were in place, including screening questions prior to the visit, additional usage of staff PPE, and extensive cleaning of exam room while observing appropriate contact time as indicated for disinfecting solutions.   Assessment & Plan:

## 2019-04-01 ENCOUNTER — Telehealth: Payer: Self-pay

## 2019-04-01 NOTE — Telephone Encounter (Signed)
Called pt's daughter, [name], LVM with details below.

## 2019-04-01 NOTE — Telephone Encounter (Signed)
I have placed order for [facility] care management to contact them. This is the only resource I can assess without visit. They can talk to a Education officer, museum in the ER if they have visit there.

## 2019-04-01 NOTE — Telephone Encounter (Signed)
Copied from [place] [id number]. Topic: General - Inquiry >> Mar 31, 2019  6:48 PM Bradley Meza wrote: Reason for CRM: Patient is wanting to speak with a social worker regarding her father the patient . Please advise

## 2019-04-01 NOTE — Addendum Note (Signed)
Addended by: Pricilla Holm A on: 04/01/2019 08:54 AM   Modules accepted: Orders

## 2019-04-05 ENCOUNTER — Other Ambulatory Visit: Payer: Self-pay | Admitting: *Deleted

## 2019-04-05 NOTE — Patient Outreach (Signed)
[facility]) Care Management  04/05/2019  Bradley Meza Mar 02, 1924 [id number]   Referral Date: 04/01/2019 Referral Source: MD office Referral Reason: new memory problems and aggression may need placement long term   Insurance: [place] attempt #1, successful to daughter.  Social: Patient lives alone but has daughter [name]) that lives locally and provides assistance with his care.  Patient independent in ADL's but received meals from daughter.  Has son that lives in [place].  Daughter reports that she has noticed some decline in member's memory for quite some time where he has not been able to find his words and has become very forgetful.  State she became very concerned on Christmas Eve when she went to his home and saw him driving around the neighborhood.  State she thought he would return home shortly so she waited for him however he didn't return.  Report she was going to report him missing but realized it hadn't been 24 hours yet.  She received a call from an unknown number who reported being with the member at their store in [place], 3 hours away.  The store owner reported that the member was confused and they along with the daughter and the sheriff decided to have member evaluated at the local ED in the event that he was having an emergency medical event.  She and her brother (who was in town for the holiday) drove to [facility] to pick up member from the hospital, state member still wasn't able to explain how he ended up in [facility] but was very argumentative when they expressed concern about him being able to care for himself.  Daughter report member has been irritable with her since this event and has refused to reason with her regarding management of his care.  Daughter prepares meals for member however she state he will only eat once a day.  She encourages him to eat more and drink to prevent dehydration.  She will buy some Ensure for member to help with nutritional  supplements.  Although member lives alone, she is there for support as well as long time neighbors.  Conditions: Per chart, history of prostate cancer.  Daughter now concerned with possibility of leukemia based on recent lab results (WBC elevated and Platelets low).  She would like for PCP office to do further evaluation.  Medications: No prescription medications, taking vitamins and daily low dose aspirin.  Appointments: Had visit scheduled with PCP on 12/28 but he left, refused to be seen.  Daughter had this appointment scheduled after his visit to the ED in [place] but member did not think he needed to be assessed.  She feels that member will be more open to the appointment if the office calls him directly to schedule the visit as he has reportedly told her if he need to see the doctor they will call him.  Advance Directives: Daughter report that she current has POA since her brother lives out of town however they both will make decisions.  Plan: RN CM will contact PCP office to request they contact him for appointment.  Will also place referral to social worker for long term plan for management of member's care.  Will follow up with daughter within the next 2 weeks.  Fall Risk  04/05/2019 03/04/2018 03/18/2016 03/14/2015  Falls in the past year? 0 0 No No  Number falls in past yr: 0 - - -  Injury with Fall? 0 - - -  Risk for fall  due to : No Fall Risks - - -   Depression screen [facility] 2/9 03/04/2018 03/18/2016 03/14/2015  Decreased Interest 0 0 0  Down, Depressed, Hopeless 0 0 0  PHQ - 2 Score 0 0 0   THN CM Care Plan Problem One     Most Recent Value  Care Plan Problem One  Knowledge deficit regarding management of dementia as evidenced by family's request for assistance  Role Documenting the Problem One  Care Management [place] for Problem One  Active  [facility] Long Term Goal   Daughter will report plan for long term management of member's care within the next 31 days   THN Long Term Goal Start Date  04/05/19  Interventions for Problem One Long Term Goal  Referral placed to social worker, discussed options of long term care with daughter (ALF vs in home assistance)  THN CM Short Term Goal #1   Daughter will report increase in nutritional supplements for member within the next 3 weeks  THN CM Short Term Goal #1 Start Date  04/05/19  Interventions for Short Term Goal #1  Discussed with daughter the importance of maintaining nutritional status and eating a well balanced diet.  Encouraged to have member start Ensure for supplements.  THN CM Short Term Goal #2   Member will have follow up appointment with PCP within the next 3 weeks  THN CM Short Term Goal #2 Start Date  04/05/19  Interventions for Short Term Goal #2  Call placed to PCP office to request they contact member for appointment     [name], RN, MSN [place] Manager [phone]

## 2019-04-06 ENCOUNTER — Encounter: Payer: Self-pay | Admitting: *Deleted

## 2019-04-06 ENCOUNTER — Other Ambulatory Visit: Payer: Self-pay

## 2019-04-06 NOTE — Patient Outreach (Signed)
[name]) Care Management  04/06/2019  Bradley Meza March 21, 1924 [id number]   Social work referral received today from [employer].  "Patient showing signs of dementia, family looking for resources to aide in management of patient, no other medical history. Will need long term plan to assist patient; however, they are hoping to keep him in his home as long as possible. Daughter [name]) lives locally and is contact person, has son who lives in [place]".   Successful outreach to patient's daughter today.  Daughter reports that patient and family prefer that patient remain in his home as long as possible but there are increasing concerns about his safety.  Patient has not had official evaluation but daughter has significant concern about increase in memory loss and possible dementia.  (See [name]'s note from 04/05/19 for more information).  Daughter is hoping that patient will agree to MD visit if contacted by provider.   Informed daughter that long term, in-home aide services are not covered by Medicare.  Discussed personal care services and Medicaid eligibility.  Daughter is not certain of patient's income but does feel that he is probably over the limit to qualify for Adult Medicaid.  Also discussed Long-Term Care Medicaid, however, placement is not yet being pursued.   Patient's son is seriously considering moving in with patient, however, he currently lives in [place] so this will take some time.  Daughter checks on patient regularly and ensures that he has adequate food and other supplies.  Patient's car is currently at her house due to safety concerns if patient chooses to drive.  Informed daughter about [facility] and she agreed that this would be helpful in assisting with long term care planning.  Encouraged her to contact [employer] [facility] and/or [place] to schedule session with counselor.   Daughter denied the need for additional assistance after  this conversation today so social work case is being closed.  Provided her with my contact information and encouraged her to call if additional needs/questions arise.   [name], BSW Social Worker [phone]

## 2019-04-08 ENCOUNTER — Telehealth: Payer: Self-pay | Admitting: Internal Medicine

## 2019-04-08 NOTE — Telephone Encounter (Signed)
Tried to reach patient 1/6 to schedule hospital follow up per Dr. Sharlet Salina.  Ring and no answer.    I was able to reach patient today.  Patient states he was waiting on someone to pick him up and that he would call back to make an appointment with Dr. Sharlet Salina.    Patient sounded a little confused.

## 2019-04-19 ENCOUNTER — Other Ambulatory Visit: Payer: Self-pay | Admitting: *Deleted

## 2019-04-19 NOTE — Patient Outreach (Signed)
[facility]) Care Management  04/19/2019  Bradley Meza 01-01-1924 [id number]   Call placed to member to follow up on management of dementia and to complete initial assessment.  This care manager introduced self, he stated he was waiting on a call and thought it was someone else, otherwise he wouldn't have answered and ended call.  Call then placed to daughter, Bradley Meza, no answer.  HIPAA compliant voice message left.  Will await call back, if no call back will follow up within the next 3-4 business days.   Update:  Voice message received back from daughter after missed call.  Call placed to daughter, she report member is doing about the same if not worse.  Unfortunately she has not been able to see him much as he has not allowed her in his home.  She has attempted to call him and provide support but he reportedly keep hanging up on her.  She was able to take him food one day last week but has not been able to assess him since then.  She state he is wanting her brother to be more involved but unfortunately he still lives out of state.  He will be coming in for another visit today, will be in town for about a week.  She will have her brother call the PCP office in attempt to get visit scheduled while he is in town.  This care manager is unable to complete initial assessment as daughter is not able to answer the questions and member abruptly ended call.  Will follow up within the next week to confirm MD appointment and complete assessment.  Bradley Meza, [place], MSN [place] [phone]

## 2019-04-20 ENCOUNTER — Emergency Department (HOSPITAL_COMMUNITY)
Admission: EM | Admit: 2019-04-20 | Discharge: 2019-04-21 | Disposition: A | Discharge status: Home / Self Care | Payer: Medicare Other | Attending: Emergency Medicine | Admitting: Emergency Medicine

## 2019-04-20 ENCOUNTER — Encounter (HOSPITAL_COMMUNITY): Payer: Self-pay

## 2019-04-20 ENCOUNTER — Emergency Department (HOSPITAL_COMMUNITY): Payer: Medicare Other

## 2019-04-20 ENCOUNTER — Other Ambulatory Visit: Payer: Self-pay

## 2019-04-20 DIAGNOSIS — R404 Transient alteration of awareness: Secondary | ICD-10-CM | POA: Diagnosis not present

## 2019-04-20 DIAGNOSIS — R451 Restlessness and agitation: Secondary | ICD-10-CM | POA: Diagnosis not present

## 2019-04-20 DIAGNOSIS — Z79899 Other long term (current) drug therapy: Secondary | ICD-10-CM | POA: Diagnosis not present

## 2019-04-20 DIAGNOSIS — Z8546 Personal history of malignant neoplasm of prostate: Secondary | ICD-10-CM | POA: Insufficient documentation

## 2019-04-20 DIAGNOSIS — Z7982 Long term (current) use of aspirin: Secondary | ICD-10-CM | POA: Diagnosis not present

## 2019-04-20 DIAGNOSIS — F0391 Unspecified dementia with behavioral disturbance: Secondary | ICD-10-CM | POA: Diagnosis not present

## 2019-04-20 DIAGNOSIS — R410 Disorientation, unspecified: Secondary | ICD-10-CM | POA: Diagnosis not present

## 2019-04-20 DIAGNOSIS — Z87891 Personal history of nicotine dependence: Secondary | ICD-10-CM | POA: Diagnosis not present

## 2019-04-20 DIAGNOSIS — R4182 Altered mental status, unspecified: Secondary | ICD-10-CM | POA: Diagnosis not present

## 2019-04-20 DIAGNOSIS — I10 Essential (primary) hypertension: Secondary | ICD-10-CM | POA: Diagnosis not present

## 2019-04-20 DIAGNOSIS — R456 Violent behavior: Secondary | ICD-10-CM | POA: Diagnosis present

## 2019-04-20 DIAGNOSIS — R4689 Other symptoms and signs involving appearance and behavior: Secondary | ICD-10-CM

## 2019-04-20 DIAGNOSIS — J449 Chronic obstructive pulmonary disease, unspecified: Secondary | ICD-10-CM | POA: Diagnosis not present

## 2019-04-20 LAB — COMPREHENSIVE METABOLIC PANEL
ALT: 16 U/L (ref 0–44)
AST: 29 U/L (ref 15–41)
Albumin: 4.4 g/dL (ref 3.5–5.0)
Alkaline Phosphatase: 59 U/L (ref 38–126)
Anion gap: 13 (ref 5–15)
BUN: 25 mg/dL — ABNORMAL HIGH (ref 8–23)
CO2: 23 mmol/L (ref 22–32)
Calcium: 9.8 mg/dL (ref 8.9–10.3)
Chloride: 101 mmol/L (ref 98–111)
Creatinine, Ser: 2.14 mg/dL — ABNORMAL HIGH (ref 0.61–1.24)
GFR calc Af Amer: 30 mL/min — ABNORMAL LOW (ref >60)
GFR calc non Af Amer: 26 mL/min — ABNORMAL LOW (ref >60)
Glucose, Bld: 122 mg/dL — ABNORMAL HIGH (ref 70–99)
Potassium: 5.6 mmol/L — ABNORMAL HIGH (ref 3.5–5.1)
Sodium: 137 mmol/L (ref 135–145)
Total Bilirubin: 1.1 mg/dL (ref 0.3–1.2)
Total Protein: 7.2 g/dL (ref 6.5–8.1)

## 2019-04-20 LAB — CBC WITH DIFFERENTIAL/PLATELET
Abs Immature Granulocytes: 0 10*3/uL (ref 0.00–0.07)
Basophils Absolute: 0 10*3/uL (ref 0.0–0.1)
Basophils Relative: 0 %
Eosinophils Absolute: 0 10*3/uL (ref 0.0–0.5)
Eosinophils Relative: 0 %
HCT: 44.7 % (ref 39.0–52.0)
Hemoglobin: 13.9 g/dL (ref 13.0–17.0)
Lymphocytes Relative: 72 %
Lymphs Abs: 27.1 10*3/uL — ABNORMAL HIGH (ref 0.7–4.0)
MCH: 30.3 pg (ref 26.0–34.0)
MCHC: 31.1 g/dL (ref 30.0–36.0)
MCV: 97.6 fL (ref 80.0–100.0)
Monocytes Absolute: 1.1 10*3/uL — ABNORMAL HIGH (ref 0.1–1.0)
Monocytes Relative: 3 %
Neutro Abs: 9.4 10*3/uL — ABNORMAL HIGH (ref 1.7–7.7)
Neutrophils Relative %: 25 %
Platelets: 107 10*3/uL — ABNORMAL LOW (ref 150–400)
RBC: 4.58 MIL/uL (ref 4.22–5.81)
RDW: 14.1 % (ref 11.5–15.5)
WBC: 37.7 10*3/uL — ABNORMAL HIGH (ref 4.0–10.5)
nRBC: 0 % (ref 0.0–0.2)

## 2019-04-20 LAB — ETHANOL: Alcohol, Ethyl (B): <10 mg/dL (ref <10)

## 2019-04-20 LAB — SALICYLATE LEVEL: Salicylate Lvl: <7 mg/dL — ABNORMAL LOW (ref 7.0–30.0)

## 2019-04-20 LAB — ACETAMINOPHEN LEVEL: Acetaminophen (Tylenol), Serum: <10 ug/mL — ABNORMAL LOW (ref 10–30)

## 2019-04-20 MED ORDER — LACTATED RINGERS IV BOLUS: 1000.0000 mL | Freq: Once | INTRAVENOUS | Status: DC

## 2019-04-20 NOTE — Care Management (Signed)
ED CM met with patient at bedside in [place], CM introduced self, patient then became agitated and asked CM to leave the room, Patient's speech appears somewhat pressured.  EDP has decided on keeping patient overnight to reevaluate in the am.

## 2019-04-20 NOTE — Consult Note (Signed)
  Tele Assessment  Bradley Meza, 86 y.o., male patient presented to [facility] after an episode of aggressive behavior and having to be put in restraints by police.  Patient seen via telepsych by TTS and this provider; chart reviewed and consulted with Dr. Dwyane Dee on 04/20/19.  On evaluation Bradley Meza states that he does not need to be in the hospital.  States he has had a bad day.  Patient denies suicidal/self-harm/homicidal ideation, psychosis, and paranoia.  Patient states that he lives alone and able to care for himself.  Patient family reports that patient is not able to care for himself at home and that he has early dementia but has not been formally diagnosed.  Family and provider are tying to get patient placed in nursing facility.  Patient keys/car was recently taken away and family thought it would be a good idea to take patient to a car dealership but had it worked out where patient would be told that he was unable to purchase the car.  Patient became upset and agitated and police had to be called.   During evaluation Bradley Meza is sitting up in bed; he is alert/oriented x ; calm/cooperative; and mood congruent with affect.  Patient is speaking in a clear tone at moderate volume, and normal pace; at time patient may stutter trying to get out his thoughts or words.  He has good eye contact.  His thought process is coherent and relevant; There is no indication that he is currently responding to internal/external stimuli or experiencing delusional thought content.  Patient denies suicidal/self-harm/homicidal ideation, psychosis, and paranoia.  Patient has remained calm throughout assessment and has answered questions appropriately.  Collateral information:  Spoke with patients daughter for collateral information [name] at [phone])  [name] states that she doesn't feel that patient need psychiatric hospitalization.  States that he needs assistance at home with nutrition and ADLs.  States he  has gotten to the point that he needs help but is resistant with the family and gets angry when she tries to help.  States that she is in the process of working with a Chief Executive Officer to get guardianship.  Her brother lives out of state but she is trying to make arrangement where he can also come down and assist with caring for their father.  Informed that a social work consult has been ordered that may be able to assist and give resources and that social work can also consult with the Education officer, museum at patient primary providers office.  Also discussed adult protective services could also evaluate patient and be a helpful resource if patient is unable to care for himself at home.       Recommendations:  Social work consult ordered to assist family with resources for skilled nursing placement or home health care.  May need to consult with adult protective services if patient is unable to care for self.      Disposition:  Patient psychiatrically cleared.   No evidence of imminent risk to self or others at present.   Patient does not meet criteria for psychiatric inpatient admission. Supportive therapy provided about ongoing stressors. Discussed crisis plan, support from social network, calling 911, coming to the Emergency Department, and calling Suicide Hotline.    Spoke with Dr. Maryan Rued informed of recommendation and disposition

## 2019-04-20 NOTE — Social Work (Signed)
EDCSW met with Pt at bedside. Pt was pleasant but had some suspicions as to CSWs motives. Pt was unable to answer question regarding eating habits and living arrangements beyond yes/no answers.  Pt reported that he was actively trying to lose weight although he was proudly showing CSW his tightly cinched belt.   CSW called daughter, [name], at [phone], to update and offer resources, but daughter already had many options.

## 2019-04-20 NOTE — BH Assessment (Signed)
Tele Assessment Note   Patient Name: Bradley Meza MRN: [id number] Referring Physician: Blanchie Dessert, MD Location of Patient: [facility] Location of Provider: [place] Department  Bradley Meza is a widowed 86 y.o. male who presents to [facility] via [employer]. Pt was pleasant and friendly until he was asked questions he thought were unreasonable.  Pt then refused to participate further with assessment.  Pt brought to ED after an episode of aggressive behavior, refused to leave a car dealership and had to be put in restraints by police.  Patient seen via telepsych by this writer and Earleen Newport, NP  Patient states that he lives alone and able to care for himself.    Patient keys/car was recently taken away and family thought it would be a good idea to take patient to a car dealership but had it worked out where patient would be told that he was unable to purchase the car.  Patient became upset and agitated and police had to be called Pt denies SI, HI and AVH.  He lives alone, and supports include his daughter who lives locally and a son in [place]. Pt has recently been refusing help from dtr. Pt's family reports that he is not able to safely care for himself at home and that he has early dementia but has not been formally diagnosed.  Pt has not been cooperative with medical interventions/ assessments to get a formal diagnosis.  Shuvon Rankin, NP and this Probation officer spoke with pt's dtr by phone. She states they are looking into options that may be able to keep pt safely in his home. If pt continues to be unwilling to receive their help and assure his safety, dtr was advised that a social work consult or an APS referral may be helpful.  Pt denies alcohol/ substance abuse. ? MSE: Pt is neatly dressed with a hat on his head, alert, oriented x3 with normal speech and normal motor behavior. Eye contact is good. Pt's mood is pleasant and irrible and affect is congruent with mood. Thought process is coherent and  relevant. There is no indication pt is currently responding to internal stimuli or experiencing delusional thought content.   Disposition: Earleen Newport, NP recommends psychiatric clearance  Diagnosis: r/o early dementia  Past Medical History:  Past Medical History:  Diagnosis Date  . Allergy history unknown   . Blood donor, other   . COPD (chronic obstructive pulmonary disease) ([place])   . Diverticulosis of colon   . DJD (degenerative joint disease)   . GERD (gastroesophageal reflux disease)   . Hypercholesteremia   . Lumbar back pain   . Prostate cancer ([address])   . Renal cyst     Past Surgical History:  Procedure Laterality Date  . RIH repair      Family History:  Family History  Problem Relation Age of Onset  . Arthritis Mother     Social History:  reports that he quit smoking about 31 years ago. His smoking use included cigarettes. He has quit using smokeless tobacco. He reports current alcohol use. No history on file for drug.  Additional Social History:  Alcohol / Drug Use Pain Medications: See MAR Prescriptions: See MAR Over the Counter: SeeMAR History of alcohol / drug use?: No history of alcohol / drug abuse  CIWA: CIWA-Ar BP: (!) 163/93 Pulse Rate: 95 COWS:    Allergies: No Known Allergies  Home Medications: (Not in a hospital admission)   OB/GYN Status:  No LMP for male patient.  General  Assessment Data Location of Assessment: [facility] ED TTS Assessment: In system Is this a Tele or Face-to-Face Assessment?: Tele Assessment Is this an Initial Assessment or a Re-assessment for this encounter?: Initial Assessment Patient Accompanied by:: N/A Language Other than English: No Living Arrangements: Other (Comment) What gender do you identify as?: Male Marital status: Widowed Living Arrangements: Alone Can pt return to current living arrangement?: Yes Is patient capable of signing voluntary admission?: Yes Referral Source: MD Insurance type: [place] Living Arrangements: Alone Name of Psychiatrist: none Name of Therapist: none  Education Status Is patient currently in school?: No  Risk to self with the past 6 months Suicidal Ideation: No Suicidal Intent: No Suicidal Plan?: No Persecutory voices/beliefs?: No Depression Symptoms: Feeling angry/irritable Substance abuse history and/or treatment for substance abuse?: No Suicide prevention information given to non-admitted patients: Not applicable  Risk to Others within the past 6 months Homicidal Ideation: No Thoughts of Harm to Others: No Current Homicidal Intent: No Current Homicidal Plan: No  Psychosis Hallucinations: None noted Delusions: (denies)  Mental Status Report Appearance/Hygiene: Unremarkable Eye Contact: Fair Speech: Loud, Logical/coherent, Aphasic Level of Consciousness: Alert, Irritable Mood: Pleasant, Irritable Affect: Irritable, Other (Comment)(pleasant) Anxiety Level: Minimal Thought Processes: Coherent, Relevant Judgement: Partial Orientation: Person, Situation, Place Obsessive Compulsive Thoughts/Behaviors: None  Cognitive Functioning Concentration: Fair Memory: Unable to Assess Is patient IDD: No Insight: Fair Impulse Control: Fair Appetite: (UTA) Sleep: Unable to Assess Vegetative Symptoms: Unable to Assess  ADLScreening [facility] Assessment Services) Patient able to express need for assistance with ADLs?: Yes Independently performs ADLs?: Yes (appropriate for developmental age)  Prior Inpatient Therapy Prior Inpatient Therapy: No  Prior Outpatient Therapy Prior Outpatient Therapy: No Does patient have an ACCT team?: No Does patient have Intensive In-House Services?  : No Does patient have [facility] services? : No Does patient have P4CC services?: No  ADL Screening (condition at time of admission) Patient able to express need for assistance with ADLs?: Yes Independently performs ADLs?: Yes (appropriate for  developmental age)       Abuse/Neglect Assessment (Assessment to be complete while patient is alone) Abuse/Neglect Assessment Can Be Completed: Unable to assess, patient is non-responsive or altered mental status Values / Beliefs Cultural Requests During Hospitalization: None Spiritual Requests During Hospitalization: None Consults Spiritual Care Consult Needed: No Transition of Care Team Consult Needed: No Advance Directives (For Healthcare) Does Patient Have a Medical Advance Directive?: No Does patient want to make changes to medical advance directive?: No - Patient declined Would patient like information on creating a medical advance directive?: No - Patient declined          Disposition: Shuvon Rankin, NP recommends psychiatric clearance Disposition Initial Assessment Completed for this Encounter: Yes  This service was provided via telemedicine using a 2-way, interactive audio and video technology.    Antavius Sperbeck H Aquilla Voiles 04/20/2019 7:06 PM

## 2019-04-20 NOTE — ED Notes (Signed)
Pt transported to CT ?

## 2019-04-20 NOTE — ED Notes (Signed)
TTS done 

## 2019-04-20 NOTE — Progress Notes (Addendum)
CSW spoke with Talbot Grumbling, NP, patient has been medically cleared by psychiatry and is not appropriate for inpatient treatment at this time. The patient will need a social work consult to assist with placement in next level of care.   If the patient becomes aggressive or has a sudden change in behavior, please re-consult for a re-assessment.   Domenic Schwab, MSW, LCSW-A Clinical Disposition Social Worker [employer] [facility]/TTS [phone]

## 2019-04-20 NOTE — ED Provider Notes (Signed)
[facility]MENT Provider Note   CSN: [id number] Arrival date & time: 04/20/19  1522     History Chief Complaint  Patient presents with  . Aggressive Behavior    Bradley Meza is a 86 y.o. male.  Patient is a 86 year old male with a history of COPD, hyper cholesterolemia and per his daughter some memory issues over the last year who presents today with police for agitation and refusing to leave a car dealership.  Patient will not give any history so his history comes from his daughter who is also present with him.  She states over the last year she has noticed some occasional forgetfulness as patient does live alone but around Christmas time he got in his car and ended up in [place]. At that time he stayed in the hospital overnight until they were able to drive to [place] and get him. He was noted to have an elevated white cell count at that time but chest x-ray and other blood work was normal. They did make an appointment to follow-up with his regular doctor but he became agitated while waiting in the exam room and left before the doctor was able to see him. Daughter states in the last 6 weeks he has become much more agitated and argumentative. They did take his vehicle away from him after he drove to [place] and he has been fixated on buying a new car. Today they went to the car dealership under the impression he would not be actually buying a car as daughter had called ahead and already spoken with the manager there. When the manager refused to sell him a car he sat in a chair and refused to leave. Daughter states that he does not take any medication regularly but may take a baby aspirin once in a while. As far she knows he has not had any infectious symptoms he is not seem to have difficulty walking and no history of falls. The last time he saw the doctor was approximately 2 years ago. Patient has no complaints here and just would like to go  home.  The history is provided by the patient and a relative.       Past Medical History:  Diagnosis Date  . Allergy history unknown   . Blood donor, other   . COPD (chronic obstructive pulmonary disease) ([place])   . Diverticulosis of colon   . DJD (degenerative joint disease)   . GERD (gastroesophageal reflux disease)   . Hypercholesteremia   . Lumbar back pain   . Prostate cancer ([place])   . Renal cyst     Patient Active Problem List   Diagnosis Date Noted  . Routine general medical examination at a health care facility 03/14/2015  . Kidney stone 08/09/2013  . PROSTATE CANCER 05/18/2009    Past Surgical History:  Procedure Laterality Date  . RIH repair         Family History  Problem Relation Age of Onset  . Arthritis Mother     Social History   Tobacco Use  . Smoking status: Former Smoker    Types: Cigarettes    Quit date: 04/01/1988    Years since quitting: 31.0  . Smokeless tobacco: Former Network engineer Use Topics  . Alcohol use: Yes    Comment: social use  . Drug use: Not on file    Home Medications Prior to Admission medications   Medication Sig Start Date End Date Taking? Authorizing Provider  aspirin 81 MG tablet Take 81 mg by mouth daily.    [provider]  cholecalciferol (VITAMIN D) 1000 UNITS tablet Take 1,000 Units by mouth daily.    [provider]  Multiple Vitamins-Minerals (CENTRUM SILVER PO) Take 1 tablet by mouth daily.    [provider]    Allergies    Patient has no known allergies.  Review of Systems   Review of Systems  All other systems reviewed and are negative.   Physical Exam Updated Vital Signs BP (!) 171/101   Pulse (!) 108   Temp 98.8 F (37.1 C) (Oral)   Resp (!) 22   Ht 6\' 1"  (1.854 m)   Wt 81.6 kg   SpO2 97%   BMI 23.75 kg/m   Physical Exam Vitals and nursing note reviewed.  Constitutional:      General: He is not in acute distress.    Appearance: Normal appearance. He is  well-developed and normal weight.  HENT:     Head: Normocephalic and atraumatic.     Nose: Nose normal.     Mouth/Throat:     Mouth: Mucous membranes are moist.  Eyes:     Conjunctiva/sclera: Conjunctivae normal.     Pupils: Pupils are equal, round, and reactive to light.  Cardiovascular:     Rate and Rhythm: Regular rhythm. Tachycardia present.     Heart sounds: No murmur.  Pulmonary:     Effort: Pulmonary effort is normal. No respiratory distress.     Breath sounds: Normal breath sounds. No wheezing or rales.  Abdominal:     General: There is no distension.     Palpations: Abdomen is soft.     Tenderness: There is no abdominal tenderness. There is no guarding or rebound.  Musculoskeletal:        General: No tenderness. Normal range of motion.     Cervical back: Normal range of motion and neck supple.     Right lower leg: No edema.     Left lower leg: No edema.  Skin:    General: Skin is warm and dry.     Capillary Refill: Capillary refill takes less than 2 seconds.     Findings: No erythema or rash.  Neurological:     Mental Status: He is alert.     Comments: Patient is oriented to person and place when asked the month he says June then July and then states it is actually January. He thinks it is 2020. He will not state who the president is right now.  Psychiatric:     Comments: Patient is calm but refuses to answer any questions and sits with his arms crossed     ED Results / Procedures / Treatments   Labs (all labs ordered are listed, but only abnormal results are displayed) Labs Reviewed  CBC WITH DIFFERENTIAL/PLATELET - Abnormal; Notable for the following components:      Result Value   WBC 37.7 (*)    Platelets 107 (*)    Neutro Abs 9.4 (*)    Lymphs Abs 27.1 (*)    Monocytes Absolute 1.1 (*)    All other components within normal limits  COMPREHENSIVE METABOLIC PANEL - Abnormal; Notable for the following components:   Potassium 5.6 (*)    Glucose, Bld 122 (*)     BUN 25 (*)    Creatinine, Ser 2.14 (*)    GFR calc non Af Amer 26 (*)    GFR calc Af Amer 30 (*)  All other components within normal limits  SALICYLATE LEVEL - Abnormal; Notable for the following components:   Salicylate Lvl [id number] (*)    All other components within normal limits  ACETAMINOPHEN LEVEL - Abnormal; Notable for the following components:   Acetaminophen (Tylenol), Serum <10 (*)    All other components within normal limits  ETHANOL  RAPID URINE DRUG SCREEN, HOSP PERFORMED  PATHOLOGIST SMEAR REVIEW    EKG EKG Interpretation  Date/Time:  Tuesday April 20 2019 16:31:27 EST Ventricular Rate:  83 PR Interval:    QRS Duration: 72 QT Interval:  347 QTC Calculation: 408 R Axis:   65 Text Interpretation: Sinus rhythm Atrial premature complex Anteroseptal infarct, age indeterminate ST elevation, consider inferior injury No significant change since last tracing Confirmed by Blanchie Dessert [phone]) on 04/20/2019 4:39:39 PM   Radiology CT HEAD WO CONTRAST  Result Date: 04/20/2019 CLINICAL DATA:  Altered mental status EXAM: CT HEAD WITHOUT CONTRAST TECHNIQUE: Contiguous axial images were obtained from the base of the skull through the vertex without intravenous contrast. COMPARISON:  None. FINDINGS: Brain: No acute territorial infarction, hemorrhage or intracranial mass. Moderate atrophy. Mild small vessel ischemic change of the white matter. Mildly prominent ventricles felt secondary to atrophy Vascular: No hyperdense vessels. Scattered carotid vascular calcification Skull: Normal. Negative for fracture or focal lesion. Sinuses/Orbits: No acute finding. Other: None IMPRESSION: 1. No CT evidence for acute intracranial abnormality. 2. Atrophy and mild small vessel ischemic change of the white matter Electronically Signed   By: Donavan Foil M.D.   On: 04/20/2019 17:27    Procedures Procedures (including critical care time)  Medications Ordered in ED Medications - No data to  display  ED Course  I have reviewed the triage vital signs and the nursing notes.  Pertinent labs & imaging results that were available during my care of the patient were reviewed by me and considered in my medical decision making (see chart for details).    MDM Rules/Calculators/A&P                      Elderly male presenting today with police for agitated behavior and refusing to leave a car dealership. Speaking with his daughter she gives the entire history over the last 6 weeks he has become much more agitated and argumentative. She had noticed some in very issues over the last year but in the last 6 weeks they become much worse. He has had episodes of driving to [place] and being hospitalized overnight until they were able to come and pick him up. He does not take any medications regularly and is still currently living alone. Because they took away his car he has been much more argumentative and attempting to rent cars, by cars and he has become more agitated with her. Patient states he feels fine and he would just like to go home. However upon any further questioning he refuses to speak. He does seem to be oriented to person and place but has difficulty with the year and time. Patient has not been seen by a PCP in approximately 2 years. The daughter did speak with somebody with behavioral health on the scene today but he has not been formally evaluated. When he was seen in the emergency room in [place] he did have an elevated white cell count and rest of baseline labs and chest x-ray were normal. Will CT head to ensure no acute lesions and repeat lab work. If all is normal will  consult behavioral health.  6:56 PM Patient's head CT without acute findings, CBC with a leukocytosis today of 37,000 however looking back 2 years ago patient has had elevated white counts and suspect there is an underlying cancer that he has not followed up with a physician for.,  CMP elevated today at 2.14  from his baseline of 1.2 based on lab work done on 03/25/2019 and will give a bolus of IV fluids.  UA is still pending.  Alcohol and Tylenol and salicylate levels are within normal limits.  TTS to evaluate.   9:19 PM TTS saw the patient and states that he is alert and oriented x3 and that he does not meet any psychiatric criteria.  Transitional care team to evaluate for home needs.  However on my evaluation and case management evaluation patient is not alert and oriented and they feel that he would benefit from a [facility] psych evaluation with a psychiatrist.  Will reconsult for repeat evaluation at this time.  Patient is refusing to drink and is refusing IV fluids.  He continues to remain agitated he may need a small dose of Ativan or Haldol. Pt has disorganized thoughts, no insight, and unable to hold a fluid conversation.  Pt appears to be losing weight and concern he may be forgetting to eat.  Due to pt being agitated and refusing medical care he is not receiving the care he needs.  Pt's daughter is currently working on guardianship but at this time he will not let anyone in his house to help him and is agitated with daughter.  Final Clinical Impression(s) / ED Diagnoses Final diagnoses:  None    Rx / DC Orders ED Discharge Orders    None       Blanchie Dessert, MD 04/21/19 [phone]
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
## 2019-04-21 ENCOUNTER — Encounter (HOSPITAL_COMMUNITY): Payer: Self-pay | Admitting: Registered Nurse

## 2019-04-21 LAB — PATHOLOGIST SMEAR REVIEW

## 2019-04-21 NOTE — ED Notes (Signed)
Refused vitals 

## 2019-04-21 NOTE — ED Notes (Signed)
Pt refused lunch tray. Pt stating "No I don't want it, I will eat when I go home and leave from here"

## 2019-04-21 NOTE — ED Notes (Signed)
Pt refused vitals 

## 2019-04-21 NOTE — ED Notes (Signed)
Pt ambulated to bathroom with stand by assist.  

## 2019-04-21 NOTE — ED Notes (Signed)
Diet was ordered for lunch

## 2019-04-21 NOTE — Progress Notes (Addendum)
CSW received call from Dr. Rex Kras stating that TTS reevaluated patient and they again cleared him for geri psych.   CSW spoke with patient's daughter, [name], regarding patient's needs. [name] reports she and patient's son, Patrick Jupiter have been speaking and would like patient to come home. She reports both of them will be at the hospital to pick patient up once he is discharged. [name] also asked CSW about some referrals to get patient further help in the community. CSW encouraged [name] to reach out to these resources to ensure patient has the best care at home. [name] reports patient's son will be staying with patient for extra support and care. No other social work needs noted. CSW signing off. EDP and RN aware of disposition.   Golden Circle, LCSW Transitions of Care Department [facility] ED [phone]

## 2019-04-21 NOTE — ED Provider Notes (Signed)
Pt originally seen by Dr. Maryan Rued for aggression and behavioral problems in setting of dementia. Seen by TTS and originally psychiatrically cleared, but held overnight due to ongoing aggression/agitation for repeat psych eval and SW/CM consults.   Pt reassessed by psychiatry team and they have advised no indication for geripsych admission.  Referred back to social work.  Social work has had discussions with family and family has outpatient resources available.  They feel comfortable taking patient home and following with outpatient providers.   Triva Hueber, Wenda Overland, MD 04/21/19 1430

## 2019-04-21 NOTE — BH Assessment (Signed)
Tele Assessment Note   Patient Name: Bradley [facility]za MRN: [id number] Referring Physician: Maryruth Bun, MD Location of Patient:  [facility] Location of Provider: [place] Department  [name] is an 86 y.o. male who presented to [facility] on 04/20/19 on voluntary basis due to aggressive behavior.  He was assessed by TTS and psych-cleared with recommendation that he have a social work consult.  During the evening, Pt became aggressive and irritable with hospital staff, and hospital staff made another TTS consult..  Pt lives alone, and he is not followed by an outpatient provider.  Pt was assessed by S. Rankin, NP, with author (TTS) present.  Pt again denied suicidal ideation, homicidal ideation, hallucination, self-injurious behavior, and substance use concerns.   Pt was oriented to person and situation, but he was confused or refused to answer questions about why he was at the hospital.  Pt stated that he lives alone, that he wanted to leave the hospital and return home, and that he did not want care.  Per hospital notes, Pt has refused to eat.  Pt acknowledged that he does not eat frequently.  Pt became increasingly irritated at questions, and after several minutes, he refused to answer questions.  During assessment, Pt presented as alert.  He was gowned and wore a hat pulled down near his eyes.  Pt's mood and affect were irritable and angry.  Pt's speech was loud and argumentative.  Thought processes were within normal range, and thought content was logical and goal-oriented.  Memory was fair.  Concentration was fair.  Insight, judgment, and impulse control were fair.  Per S. Rankin, NP, Pt is psych-cleared.    Diagnosis: Dementia (per history)  Past Medical History:  Past Medical History:  Diagnosis Date  . Allergy history unknown   . Blood donor, other   . COPD (chronic obstructive pulmonary disease) ([place])   . Diverticulosis of colon   . DJD (degenerative joint disease)   . GERD  (gastroesophageal reflux disease)   . Hypercholesteremia   . Lumbar back pain   . Prostate cancer ([place])   . Renal cyst     Past Surgical History:  Procedure Laterality Date  . RIH repair      Family History:  Family History  Problem Relation Age of Onset  . Arthritis Mother     Social History:  reports that he quit smoking about 31 years ago. His smoking use included cigarettes. He has quit using smokeless tobacco. He reports current alcohol use. He reports previous drug use.  Additional Social History:  Alcohol / Drug Use Pain Medications: See MAR Prescriptions: See MAR Over the Counter: See MAR History of alcohol / drug use?: No history of alcohol / drug abuse  CIWA: CIWA-Ar BP: (!) 171/84 Pulse Rate: 81 COWS:    Allergies: No Known Allergies  Home Medications: (Not in a hospital admission)   OB/GYN Status:  No LMP for male patient.  General Assessment Data Location of Assessment: [facility] ED TTS Assessment: In system Is this a Tele or Face-to-Face Assessment?: Tele Assessment Is this an Initial Assessment or a Re-assessment for this encounter?: Initial Assessment Patient Accompanied by:: N/A Language Other than English: No Living Arrangements: (Lives alone) What gender do you identify as?: Male Marital status: Widowed Pregnancy Status: No Living Arrangements: Alone Can pt return to current living arrangement?: Yes Admission Status: Voluntary Is patient capable of signing voluntary admission?: Yes Referral Source: MD Insurance type: [facility]  Arrangements: Alone Name of Psychiatrist: none Name of Therapist: none  Education Status Is patient currently in school?: No Is the patient employed, unemployed or receiving disability?: Unemployed(Retired)  Risk to self with the past 6 months Suicidal Ideation: No Has patient been a risk to self within the past 6 months prior to admission? : No Suicidal Intent: No Has patient had any  suicidal intent within the past 6 months prior to admission? : No Is patient at risk for suicide?: No Suicidal Plan?: No Has patient had any suicidal plan within the past 6 months prior to admission? : No Previous Attempts/Gestures: No Other Self Harm Risks: Apparent self-neglect-- not eating Intentional Self Injurious Behavior: None Family Suicide History: Unknown Recent stressful life event(s): Recent negative physical changes Persecutory voices/beliefs?: No Depression: No Depression Symptoms: Feeling angry/irritable Substance abuse history and/or treatment for substance abuse?: No Suicide prevention information given to non-admitted patients: Not applicable  Risk to Others within the past 6 months Homicidal Ideation: No Does patient have any lifetime risk of violence toward others beyond the six months prior to admission? : No Thoughts of Harm to Others: No Current Homicidal Intent: No Current Homicidal Plan: No Access to Homicidal Means: No History of harm to others?: No Assessment of Violence: None Noted Does patient have access to weapons?: No Criminal Charges Pending?: No Does patient have a court date: No Is patient on probation?: No  Psychosis Hallucinations: None noted(See notes) Delusions: None noted  Mental Status Report Appearance/Hygiene: Excess accessories(Eptr jsy. jofomh jod gsvr) Eye Contact: Good Motor Activity: Freedom of movement, Unremarkable Speech: Loud, Logical/coherent, Argumentative Level of Consciousness: Alert, Irritable Mood: Irritable, Preoccupied Affect: Irritable, Other (Comment) Anxiety Level: None Thought Processes: Relevant, Coherent Judgement: Partial Orientation: Place, Situation, Time Obsessive Compulsive Thoughts/Behaviors: None  Cognitive Functioning Concentration: Fair Memory: Unable to Assess Is patient IDD: No Insight: Fair Impulse Control: Fair Appetite: Poor Have you had any weight changes? : (Apparent weight loss --  not sure amount) Sleep: Unable to Assess Vegetative Symptoms: Unable to Assess  ADLScreening [facility] Assessment Services) Patient's cognitive ability adequate to safely complete daily activities?: Yes Patient able to express need for assistance with ADLs?: Yes Independently performs ADLs?: Yes (appropriate for developmental age)  Prior Inpatient Therapy Prior Inpatient Therapy: No  Prior Outpatient Therapy Prior Outpatient Therapy: No Does patient have an ACCT team?: No Does patient have Intensive In-House Services?  : No Does patient have [facility] services? : No Does patient have P4CC services?: No  ADL Screening (condition at time of admission) Patient's cognitive ability adequate to safely complete daily activities?: Yes Is the patient deaf or have difficulty hearing?: No Does the patient have difficulty seeing, even when wearing glasses/contacts?: No Does the patient have difficulty concentrating, remembering, or making decisions?: No Patient able to express need for assistance with ADLs?: Yes Does the patient have difficulty dressing or bathing?: No Independently performs ADLs?: Yes (appropriate for developmental age) Does the patient have difficulty walking or climbing stairs?: No Weakness of Legs: None Weakness of Arms/Hands: None  Home Assistive Devices/Equipment Home Assistive Devices/Equipment: None  Therapy Consults (therapy consults require a physician order) PT Evaluation Needed: No OT Evalulation Needed: No SLP Evaluation Needed: No Abuse/Neglect Assessment (Assessment to be complete while patient is alone) Abuse/Neglect Assessment Can Be Completed: Yes Physical Abuse: Denies Verbal Abuse: Denies Sexual Abuse: Denies Exploitation of patient/patient's resources: Denies Self-Neglect: Denies Values / Beliefs Cultural Requests During Hospitalization: None Spiritual Requests During Hospitalization: None Consults Spiritual Care Consult Needed: No  Transition of  Care Team Consult Needed: No Advance Directives (For Healthcare) Does Patient Have a Medical Advance Directive?: No Does patient want to make changes to medical advance directive?: No - Patient declined Would patient like information on creating a medical advance directive?: No - Guardian declined          Disposition:  Disposition Initial Assessment Completed for this Encounter: Yes Disposition of Patient: Discharge(Per S. Rankin, NP, Pt is psych-cleared; appr. for social wor)  This service was provided via telemedicine using a 2-way, interactive audio and Radiographer, therapeutic.  Names of all persons participating in this telemedicine service and their role in this encounter. Name: Linna Caprice Role: Patient  Name: Dahlia Bailiff, NP Role: Nurse practitioner          Cornelia Copa T Jacie Tristan 04/21/2019 12:19 PM

## 2019-04-21 NOTE — ED Notes (Signed)
Patient verbalizes understanding of discharge instructions. Opportunity for questioning and answers were provided. Armband removed by staff, pt discharged from ED. Pt handed off to dtr and her brother. Chuck pads provided as pt refused to be changed. Discharge papers given to pt dtr.

## 2019-04-21 NOTE — ED Notes (Signed)
Called pt dtr to see when they were coming to pickup pt. They had just arrived. Will take pt out to them.

## 2019-04-21 NOTE — ED Notes (Signed)
Pt refused breakfast 

## 2019-04-21 NOTE — ED Notes (Signed)
Spoke with pt's dtr, [name] and informed her on pt's care and that he is being discharge. She and her brother will come to pick up pt. He is refusing discharge vitals. Will give his dtr d/c papers once she arrives.

## 2019-04-21 NOTE — Progress Notes (Signed)
CSW spoke with Dr. Rex Kras regarding TTS reevaluating patient this morning. Dr. Rex Kras reports she will have someone contact TTS for reevaluation. CSW will continue to follow up.   Golden Circle, LCSW Transitions of Care Department [facility] ED [phone]
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
## 2019-04-22 ENCOUNTER — Other Ambulatory Visit: Payer: Self-pay | Admitting: *Deleted

## 2019-04-22 ENCOUNTER — Other Ambulatory Visit: Payer: Self-pay
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
## 2019-04-26 ENCOUNTER — Ambulatory Visit: Payer: Medicare Other | Admitting: Internal Medicine

## 2019-04-26 ENCOUNTER — Telehealth: Payer: Self-pay

## 2019-04-26 NOTE — Telephone Encounter (Signed)
Spoke to the pt's daughter, [name], and informed her that PCP could do FL2. [name] stated that she would speak with the social worker to see if the process could get started and if paperwork could be sent over to our office.

## 2019-04-26 NOTE — Telephone Encounter (Signed)
New message    The daughter wants to discuss with Dr. Sharlet Salina and his dementia.  The family considering long-term placement FL 2 that's needs to be completed.

## 2019-04-26 NOTE — Telephone Encounter (Signed)
Can probably sign off on FL-2 since last seen Dec 2019. Do they want generic one otherwise some places have specific ones to be filled out if they know where he is going.

## 2019-04-27 ENCOUNTER — Other Ambulatory Visit: Payer: Self-pay

## 2019-04-27 NOTE — Patient Outreach (Signed)
[facility]) Care Management  04/27/2019  Bradley Meza [birth year] [id number]   Successful follow up call to patient's daughter today.  Daughter reports that patient had appointment with Dr. Sharlet Salina yesterday but refused to go.  Daughter is in close communication with an attorney regarding process for obtaining guardianship.  She and her brother are concerned that patient will not be open to placement at this time although they feel it's in patient's best interest and are concerned for his safety.  Per daughter, attorney informed her that they likely do not have enough "evidence" at this point for patient to be deemed incompetent due to the fact that he has not been formally evaluated.  Daughter requested that FL2 not be completed yet since it is only valid for 30 days.   Daughter has requested bi-weekly follow up calls while she, brother, and patient continue to discuss long-term care plan.  Daughter encouraged to call if needs arise between follow up calls.  [name], BSW Social Worker [phone]

## 2019-04-28 ENCOUNTER — Other Ambulatory Visit: Payer: Self-pay | Admitting: *Deleted

## 2019-04-28 NOTE — Patient Outreach (Signed)
[facility]) Care Management  04/28/2019  Bradley Meza [birth year] [id number]   Noted per chart that member's daughter has been in contact with BSW, attempting to have member placed in ALF facility.  Also noted that member was scheduled to see PCP this week for office visit and potential official diagnosis of dementia but he refused to go.  Collaborated with BSW regarding other options for assessment, discussed having [facility] go out to the home but will need permission from daughter.  Call placed to daughter, no answer, HIPAA compliant voice message left.  Will await call back, if no call back will follow up within the next 3-4 business days.  [name], [place], MSN [place] [phone]

## 2019-04-29 ENCOUNTER — Other Ambulatory Visit: Payer: Self-pay | Admitting: *Deleted
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
## 2019-05-10 ENCOUNTER — Other Ambulatory Visit: Payer: Self-pay
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
## 2019-05-11 ENCOUNTER — Other Ambulatory Visit: Payer: Self-pay

## 2019-05-11 ENCOUNTER — Ambulatory Visit: Payer: Medicare Other
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
## 2019-05-12 ENCOUNTER — Other Ambulatory Visit: Payer: Self-pay | Admitting: *Deleted

## 2019-05-12 DIAGNOSIS — Z0279 Encounter for issue of other medical certificate: Secondary | ICD-10-CM
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
## 2019-05-24 ENCOUNTER — Other Ambulatory Visit: Payer: Self-pay | Admitting: *Deleted

## 2019-05-24 NOTE — Patient Outreach (Signed)
[place] [facility]) Care Management  05/24/2019  Bradley Meza [birth year] [id number]   CSW had received transfer from [place], Amber that patient needed placement and possible guardianship. CSW called & spoke with patient's daughter, [name] who informed CSW that since she last spoke with Amber that things have improved with her father, she is going to his house almost daily, he's allowing her in, his temperment has improved. Patient had been beligerent, easily upset and there were concerns about needing to obtain guardianship to have him placed in a facility. Patient lives in his home - wife passed away 10 years ago and he has been alone since then with neighbors all around him have been keeping an eye on him. Patient's son or daughter speak with him daily, hygiene is good, house is clean, no concerns now about him living alone now that he is allowing his daughter to come in and help out. CSW offered resources to be mailed to her but she states that [employer] had sent her a packet on long term medicaid, placement resources which they are hoping to be able to keep him in his home as long as possible. CSW encouraged her to call back with any concerns or questions but CSW will close case at this time as no further CSW needs identified.    Raynaldo Opitz, LCSW [facility]  Clinical Social Worker cell #: [phone]

## 2019-05-26 ENCOUNTER — Other Ambulatory Visit: Payer: Self-pay | Admitting: *Deleted

## 2019-05-26 NOTE — Patient Outreach (Signed)
[facility] ([facility]) Care Management  05/26/2019  Bradley Meza 09/17/1923 [id number]   Voice message received from daughter stating member is now more coherent and willing to allow her to help him. Call placed to daughter, she report member has been allowing her in his home to help manage his care and provide food.  State he is still forgetful and showing signs of dementia but much more pleasant.  He still has not agreed to going to MD office but she will continue to work on that.  She has also remained in contact the lawyer regarding guardianship, will not pursue at this time but she will stay in contact with him.  Denies further needs from [facility] but state she will contact if member's behavior and needs should change in the future.  Will close case at this time and notify primary MD of closure.  [facility] CM Care Plan Problem One     Most Recent Value  Care Plan Problem One  Knowledge deficit regarding management of dementia as evidenced by family's request for assistance  Role Documenting the Problem One  Care Management Coordinator  Care Plan for Problem One  Active  [facility] Long Term Goal   Daughter will report plan for long term management of member's care within the next 31 days  [facility] Long Term Goal Start Date  04/05/19  [facility] Long Term Goal Met Date  05/26/19  [facility] CM Short Term Goal #1   Daughter will report increase in nutritional supplements for member within the next 3 weeks  [facility] CM Short Term Goal #1 Start Date  04/05/19  [facility] CM Short Term Goal #1 Met Date  05/26/19  [facility] CM Short Term Goal #2   Member will have follow up appointment with PCP within the next 3 weeks  [facility] CM Short Term Goal #2 Start Date  04/05/19  [facility] CM Short Term Goal #2 Met Date  -- [Not met]      [place], RN, MSN [facility] Care Management  Community Care Manager [phone]  

## 2019-06-03 ENCOUNTER — Ambulatory Visit (INDEPENDENT_AMBULATORY_CARE_PROVIDER_SITE_OTHER): Payer: Medicare Other | Admitting: Internal Medicine

## 2019-06-03 ENCOUNTER — Other Ambulatory Visit: Payer: Self-pay

## 2019-06-03 ENCOUNTER — Telehealth: Payer: Self-pay | Admitting: *Deleted

## 2019-06-03 ENCOUNTER — Encounter: Payer: Self-pay | Admitting: Internal Medicine

## 2019-06-03 VITALS — BP 184/90 | HR 80 | Temp 98.3°F | Resp 22 | Ht 73.0 in | Wt 149.0 lb

## 2019-06-03 DIAGNOSIS — F0281 Dementia in other diseases classified elsewhere with behavioral disturbance: Secondary | ICD-10-CM | POA: Diagnosis not present

## 2019-06-03 DIAGNOSIS — C9110 Chronic lymphocytic leukemia of B-cell type not having achieved remission: Secondary | ICD-10-CM | POA: Diagnosis not present

## 2019-06-03 DIAGNOSIS — F02818 Dementia in other diseases classified elsewhere, unspecified severity, with other behavioral disturbance: Secondary | ICD-10-CM

## 2019-06-03 DIAGNOSIS — G301 Alzheimer's disease with late onset: Secondary | ICD-10-CM

## 2019-06-03 DIAGNOSIS — Z23 Encounter for immunization: Secondary | ICD-10-CM

## 2019-06-03 LAB — COMPREHENSIVE METABOLIC PANEL
ALT: 13 U/L (ref 0–53)
AST: 21 U/L (ref 0–37)
Albumin: 4.1 g/dL (ref 3.5–5.2)
Alkaline Phosphatase: 63 U/L (ref 39–117)
BUN: 22 mg/dL (ref 6–23)
CO2: 28 mEq/L (ref 19–32)
Calcium: 9.9 mg/dL (ref 8.4–10.5)
Chloride: 98 mEq/L (ref 96–112)
Creatinine, Ser: 1.43 mg/dL (ref 0.40–1.50)
GFR: 55.71 mL/min — ABNORMAL LOW (ref >60.00)
Glucose, Bld: 112 mg/dL — ABNORMAL HIGH (ref 70–99)
Potassium: 4.5 mEq/L (ref 3.5–5.1)
Sodium: 135 mEq/L (ref 135–145)
Total Bilirubin: 0.9 mg/dL (ref 0.2–1.2)
Total Protein: 7.2 g/dL (ref 6.0–8.3)

## 2019-06-03 LAB — VITAMIN B12: Vitamin B-12: 729 pg/mL (ref 211–911)

## 2019-06-03 LAB — TSH: TSH: 1.56 u[IU]/mL (ref 0.35–4.50)

## 2019-06-03 LAB — CBC
HCT: 36.7 % — ABNORMAL LOW (ref 39.0–52.0)
Hemoglobin: 12 g/dL — ABNORMAL LOW (ref 13.0–17.0)
MCHC: 32.6 g/dL (ref 30.0–36.0)
MCV: 95.8 fl (ref 78.0–100.0)
Platelets: 174 10*3/uL (ref 150.0–400.0)
RBC: 3.84 Mil/uL — ABNORMAL LOW (ref 4.22–5.81)
RDW: 15 % (ref 11.5–15.5)
WBC: 32.4 10*3/uL (ref 4.0–10.5)

## 2019-06-03 LAB — VITAMIN D 25 HYDROXY (VIT D DEFICIENCY, FRACTURES): VITD: 37.77 ng/mL (ref 30.00–100.00)
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
## 2019-06-03 NOTE — Telephone Encounter (Signed)
Thanks, no action needed

## 2019-06-03 NOTE — Telephone Encounter (Signed)
Per [facility] lab; pt's WBC is critical at 32.4.

## 2019-06-03 NOTE — Patient Instructions (Signed)
We will advise no driving.

## 2019-06-04 DIAGNOSIS — F0390 Unspecified dementia without behavioral disturbance: Secondary | ICD-10-CM | POA: Insufficient documentation

## 2019-06-04 DIAGNOSIS — C9110 Chronic lymphocytic leukemia of B-cell type not having achieved remission: Secondary | ICD-10-CM | POA: Insufficient documentation

## 2019-06-04 DIAGNOSIS — F03918 Unspecified dementia, unspecified severity, with other behavioral disturbance: Secondary | ICD-10-CM | POA: Insufficient documentation

## 2019-06-04 NOTE — Assessment & Plan Note (Signed)
Likely age related, checking TSH, B12, CBC, CMP and adjust as needed. He is not interested in medications at this time. Advised daughter and son to keep close watch on him as finances may be impacted in the near future. No driving and if any concerns we can fill out forms for  to remove license at this time he does not have access to a vehicle.

## 2019-06-04 NOTE — Assessment & Plan Note (Signed)
Checking CBC at this time has seen oncology in the past and not on treatment. May need to reassess in the future.

## 2019-06-11 ENCOUNTER — Telehealth: Payer: Self-pay | Admitting: Internal Medicine

## 2019-06-11 NOTE — Telephone Encounter (Signed)
    Patients daughter calling states she would like to get the  process started of getting patients drivers license revoked.  Please call

## 2019-06-15 NOTE — Telephone Encounter (Signed)
Spoke with pt daughter about getting forms from the [facility] first, she verbalized understanding.

## 2019-06-29 ENCOUNTER — Encounter: Payer: Self-pay | Admitting: Internal Medicine

## 2020-05-10 ENCOUNTER — Telehealth: Payer: Self-pay | Admitting: Internal Medicine

## 2020-05-10 NOTE — Telephone Encounter (Signed)
Left message vcml to call office for CPE and AWV appointment

## 2021-04-03 IMAGING — CT CT HEAD W/O CM
4 series · 16 of 47 positions shown, 18 images · non-contrast
Comparison: None.

CLINICAL DATA: Altered mental status

EXAM:
CT HEAD WITHOUT CONTRAST
TECHNIQUE: Contiguous axial images were obtained from the base of the skull
through the vertex without intravenous contrast.

[Series 3: head wo · axial · 0.44mm/px · z∈[+1116,+1236]mm · 7 of 32 slices shown, 9 images]
[im 4/32  brain]
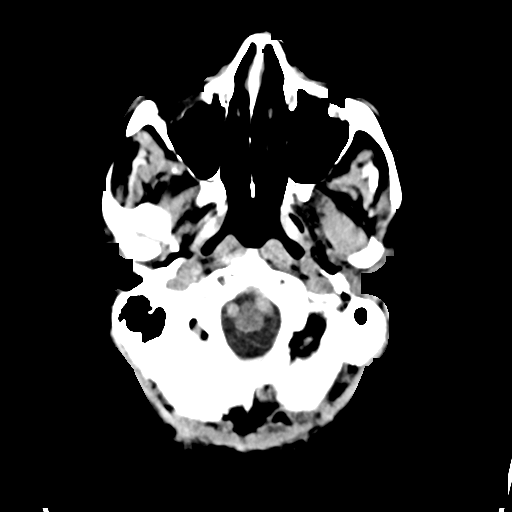
[im 4/32  bone]
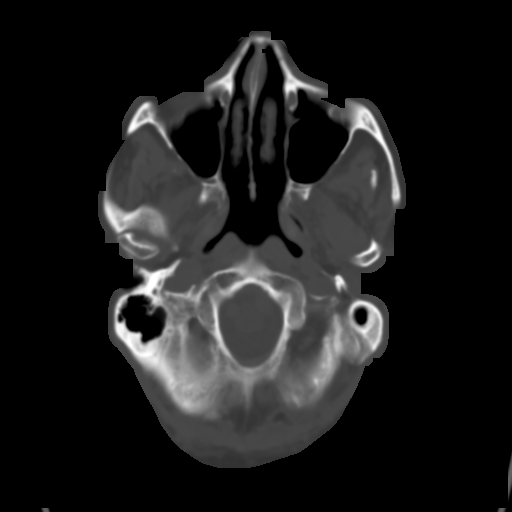
[im 8/32  brain]
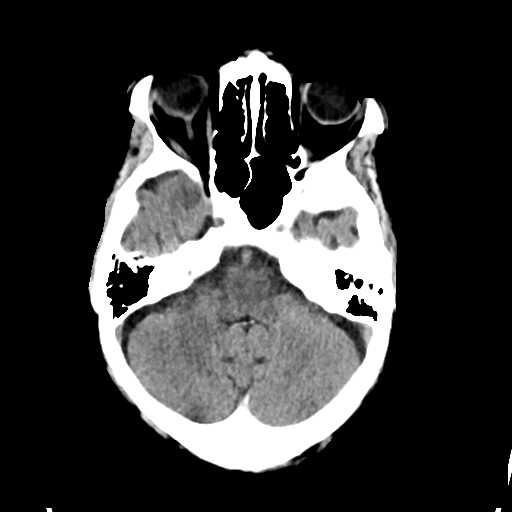
[im 12/32  brain]
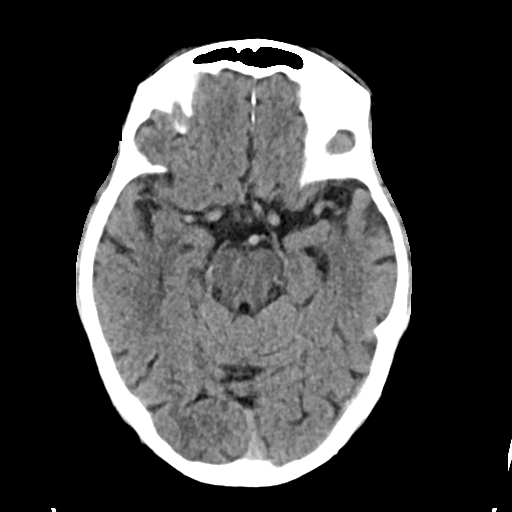
[im 16/32  brain]
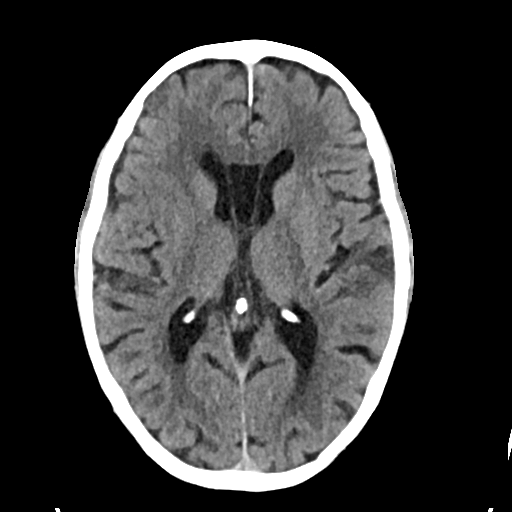
[im 20/32  brain]
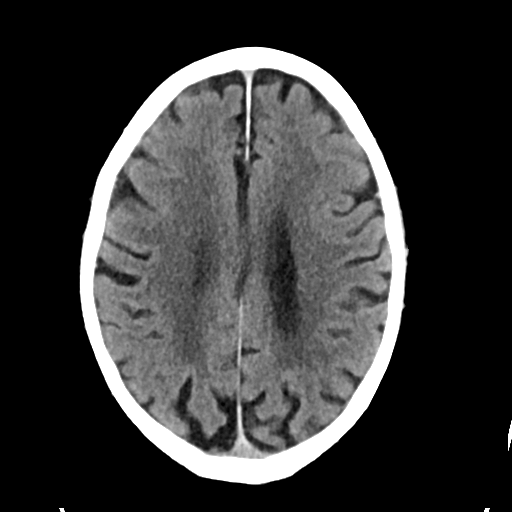
[im 20/32  bone]
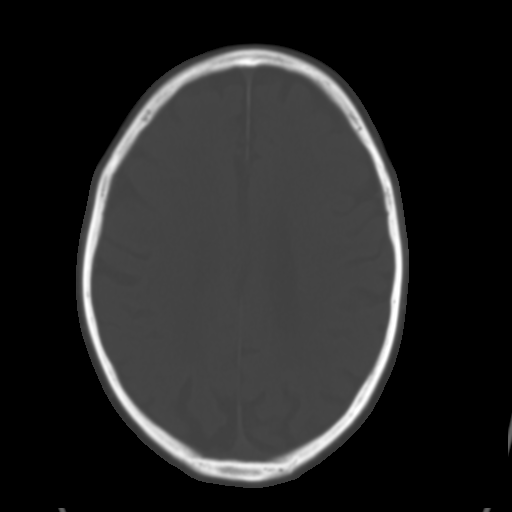
[im 24/32  brain]
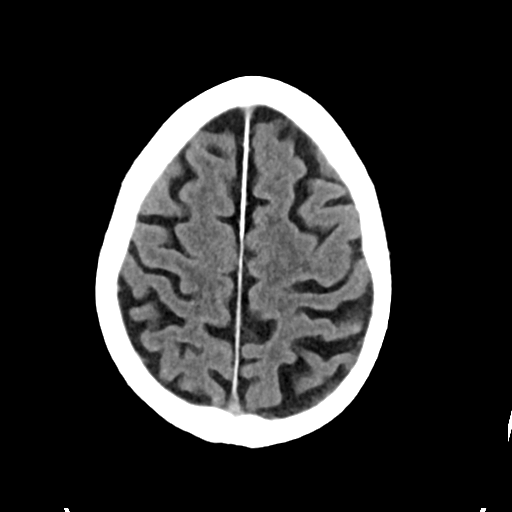
[im 28/32  brain]
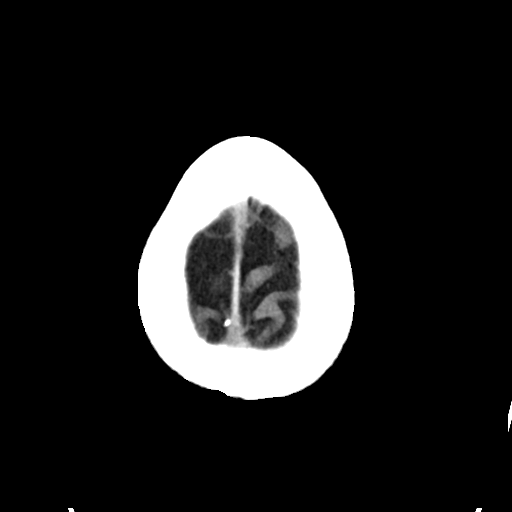

[Series 4: head bone · axial · 0.44mm/px · z∈[+1114,+1146]mm · 3 of 80 slices shown]
[im 8/80  bone]
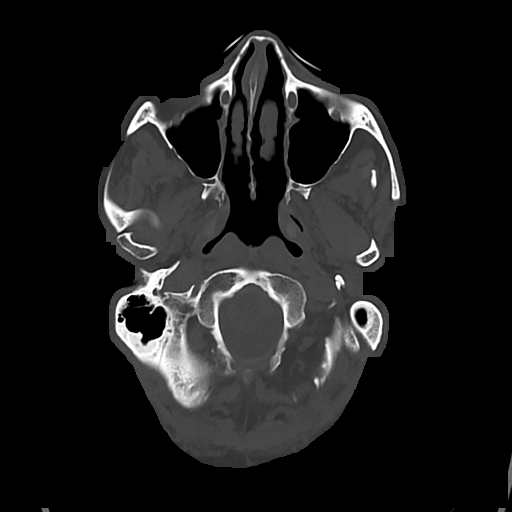
[im 16/80  bone]
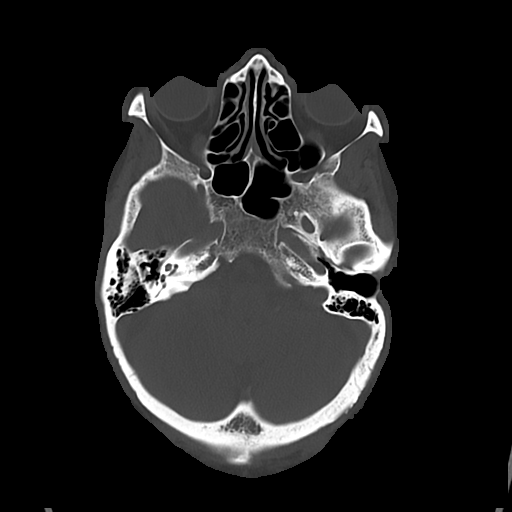
[im 24/80  bone]
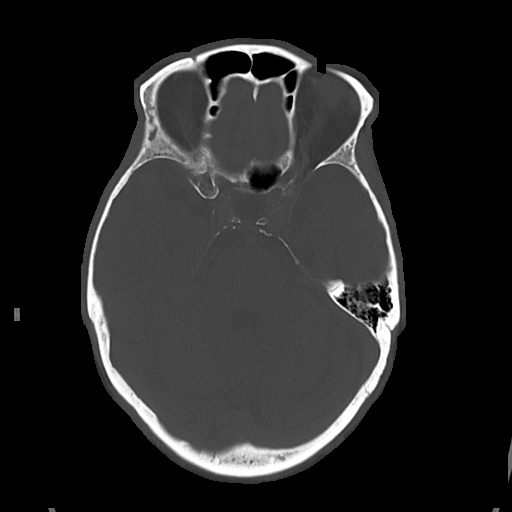

[Series 5: cor soft · coronal · 0.32mm/px · 3 of 71 slices shown]
[im 24/71  brain]
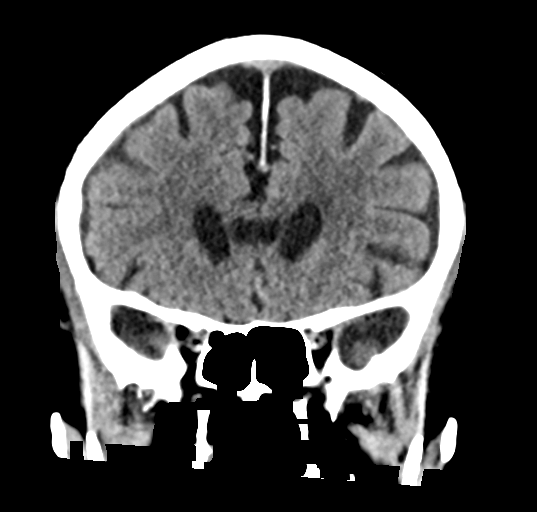
[im 32/71  brain]
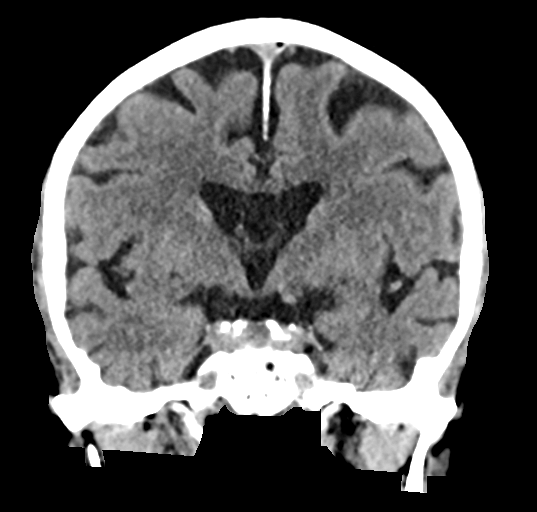
[im 39/71  brain]
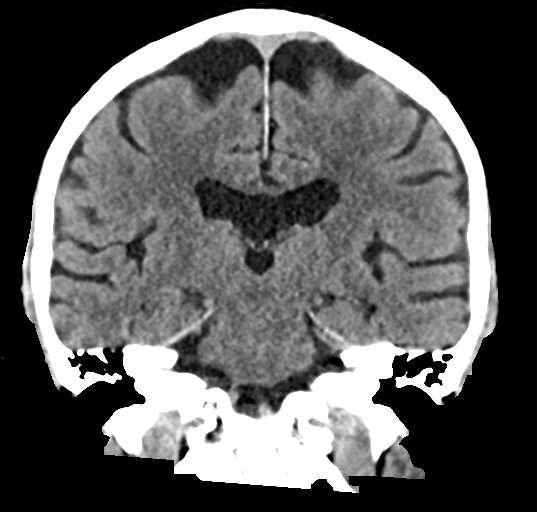

[Series 6: sag soft · sagittal · 0.32mm/px · 3 of 57 slices shown]
[im 19/57  brain]
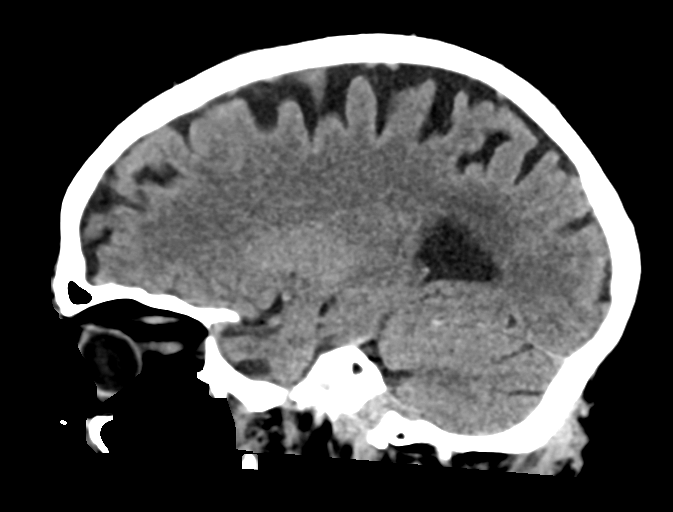
[im 29/57  brain]
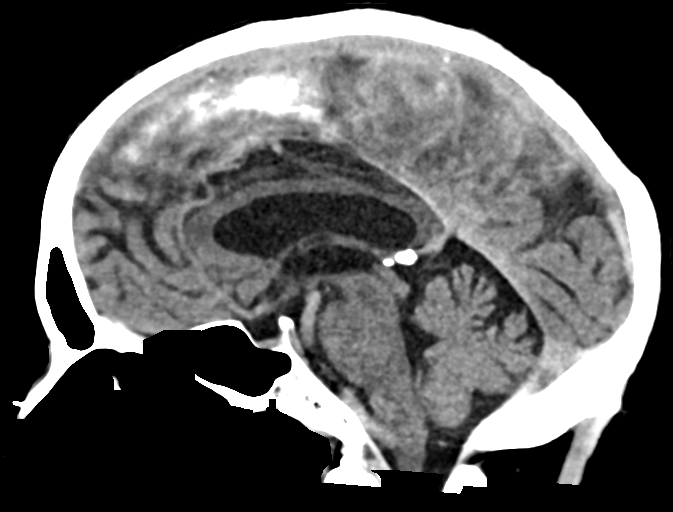
[im 38/57  brain]
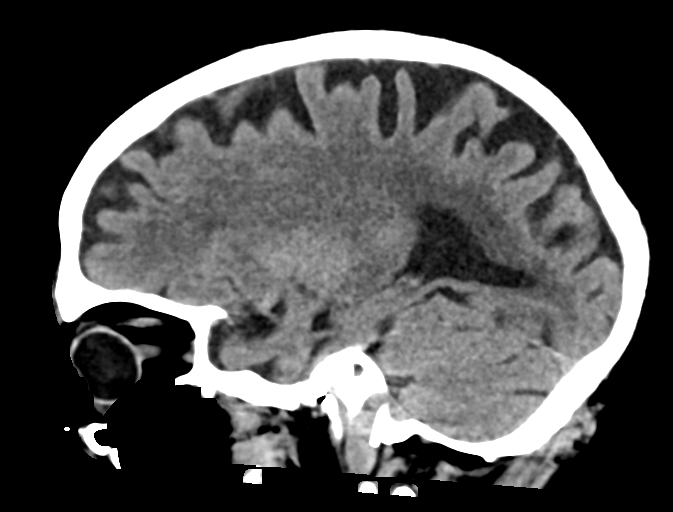

[16 of 47 positions shown; findings below may reference images not displayed]

FINDINGS: Brain: No acute territorial infarction, hemorrhage or intracranial
mass. Moderate atrophy. Mild small vessel ischemic change of the
white matter. Mildly prominent ventricles felt secondary to atrophy

Vascular: No hyperdense vessels. Scattered carotid vascular
calcification

Skull: Normal. Negative for fracture or focal lesion.

Sinuses/Orbits: No acute finding.

Other: None
IMPRESSION: 1. No CT evidence for acute intracranial abnormality.
2. Atrophy and mild small vessel ischemic change of the white matter

## 2021-08-15 ENCOUNTER — Telehealth: Payer: Self-pay

## 2021-08-15 NOTE — Telephone Encounter (Signed)
Call all numbers listed on the patients account to schedule an appointment however, there was no answer and no voicemail left. ?

## 2021-12-06 ENCOUNTER — Encounter (HOSPITAL_COMMUNITY): Payer: Self-pay

## 2021-12-06 ENCOUNTER — Observation Stay (HOSPITAL_COMMUNITY)
Admission: EM | Admit: 2021-12-06 | Discharge: 2021-12-09 | Disposition: A | Discharge status: Hospice | Payer: Medicare Other | Attending: Internal Medicine | Admitting: Internal Medicine

## 2021-12-06 ENCOUNTER — Other Ambulatory Visit: Payer: Self-pay

## 2021-12-06 DIAGNOSIS — D649 Anemia, unspecified: Secondary | ICD-10-CM | POA: Insufficient documentation

## 2021-12-06 DIAGNOSIS — F03911 Unspecified dementia, unspecified severity, with agitation: Secondary | ICD-10-CM | POA: Diagnosis present

## 2021-12-06 DIAGNOSIS — G8929 Other chronic pain: Secondary | ICD-10-CM | POA: Diagnosis present

## 2021-12-06 DIAGNOSIS — Z66 Do not resuscitate: Secondary | ICD-10-CM | POA: Diagnosis present

## 2021-12-06 DIAGNOSIS — K219 Gastro-esophageal reflux disease without esophagitis: Secondary | ICD-10-CM | POA: Insufficient documentation

## 2021-12-06 DIAGNOSIS — Z87891 Personal history of nicotine dependence: Secondary | ICD-10-CM

## 2021-12-06 DIAGNOSIS — Z7982 Long term (current) use of aspirin: Secondary | ICD-10-CM

## 2021-12-06 DIAGNOSIS — F02818 Dementia in other diseases classified elsewhere, unspecified severity, with other behavioral disturbance: Secondary | ICD-10-CM | POA: Diagnosis not present

## 2021-12-06 DIAGNOSIS — R630 Anorexia: Secondary | ICD-10-CM | POA: Insufficient documentation

## 2021-12-06 DIAGNOSIS — R000 Tachycardia, unspecified: Secondary | ICD-10-CM | POA: Diagnosis not present

## 2021-12-06 DIAGNOSIS — E872 Acidosis, unspecified: Secondary | ICD-10-CM | POA: Diagnosis present

## 2021-12-06 DIAGNOSIS — C9110 Chronic lymphocytic leukemia of B-cell type not having achieved remission: Secondary | ICD-10-CM | POA: Diagnosis not present

## 2021-12-06 DIAGNOSIS — E785 Hyperlipidemia, unspecified: Secondary | ICD-10-CM | POA: Insufficient documentation

## 2021-12-06 DIAGNOSIS — F03918 Unspecified dementia, unspecified severity, with other behavioral disturbance: Secondary | ICD-10-CM | POA: Diagnosis present

## 2021-12-06 DIAGNOSIS — M545 Low back pain, unspecified: Secondary | ICD-10-CM | POA: Diagnosis not present

## 2021-12-06 DIAGNOSIS — E875 Hyperkalemia: Secondary | ICD-10-CM | POA: Diagnosis present

## 2021-12-06 DIAGNOSIS — N1831 Chronic kidney disease, stage 3a: Secondary | ICD-10-CM | POA: Diagnosis present

## 2021-12-06 DIAGNOSIS — R531 Weakness: Secondary | ICD-10-CM | POA: Diagnosis not present

## 2021-12-06 DIAGNOSIS — Z8546 Personal history of malignant neoplasm of prostate: Secondary | ICD-10-CM

## 2021-12-06 DIAGNOSIS — J449 Chronic obstructive pulmonary disease, unspecified: Secondary | ICD-10-CM | POA: Insufficient documentation

## 2021-12-06 DIAGNOSIS — E7800 Pure hypercholesterolemia, unspecified: Secondary | ICD-10-CM | POA: Diagnosis present

## 2021-12-06 DIAGNOSIS — N179 Acute kidney failure, unspecified: Principal | ICD-10-CM

## 2021-12-06 DIAGNOSIS — D539 Nutritional anemia, unspecified: Secondary | ICD-10-CM | POA: Diagnosis present

## 2021-12-06 HISTORY — DX: Unspecified dementia, unspecified severity, without behavioral disturbance, psychotic disturbance, mood disturbance, and anxiety: F03.90

## 2021-12-06 LAB — CBC
HCT: 36 % — ABNORMAL LOW (ref 39.0–52.0)
Hemoglobin: 10.5 g/dL — ABNORMAL LOW (ref 13.0–17.0)
MCH: 31.4 pg (ref 26.0–34.0)
MCHC: 29.2 g/dL — ABNORMAL LOW (ref 30.0–36.0)
MCV: 107.8 fL — ABNORMAL HIGH (ref 80.0–100.0)
Platelets: 91 10*3/uL — ABNORMAL LOW (ref 150–400)
RBC: 3.34 MIL/uL — ABNORMAL LOW (ref 4.22–5.81)
RDW: 16.9 % — ABNORMAL HIGH (ref 11.5–15.5)
WBC: 176.9 10*3/uL (ref 4.0–10.5)
nRBC: 0 % (ref 0.0–0.2)

## 2021-12-06 LAB — CBG MONITORING, ED: Glucose-Capillary: 151 mg/dL — ABNORMAL HIGH (ref 70–99)

## 2021-12-06 LAB — BASIC METABOLIC PANEL
Anion gap: 11 (ref 5–15)
BUN: 43 mg/dL — ABNORMAL HIGH (ref 8–23)
CO2: 21 mmol/L — ABNORMAL LOW (ref 22–32)
Calcium: 9.6 mg/dL (ref 8.9–10.3)
Chloride: 112 mmol/L — ABNORMAL HIGH (ref 98–111)
Creatinine, Ser: 2.72 mg/dL — ABNORMAL HIGH (ref 0.61–1.24)
GFR, Estimated: 21 mL/min — ABNORMAL LOW (ref >60)
Glucose, Bld: 148 mg/dL — ABNORMAL HIGH (ref 70–99)
Potassium: 5.9 mmol/L — ABNORMAL HIGH (ref 3.5–5.1)
Sodium: 144 mmol/L (ref 135–145)

## 2021-12-06 MED ORDER — LACTATED RINGERS IV SOLN: INTRAVENOUS | Status: DC

## 2021-12-06 MED ORDER — ONDANSETRON HCL 4 MG/2ML IJ SOLN: 4.0000 mg | Freq: Four times a day (QID) | INTRAMUSCULAR | Status: DC | PRN

## 2021-12-06 MED ORDER — ACETAMINOPHEN 325 MG PO TABS: 650.0000 mg | ORAL_TABLET | Freq: Once | ORAL | Status: DC

## 2021-12-06 MED ORDER — HALOPERIDOL LACTATE 5 MG/ML IJ SOLN
2.5000 mg | Freq: Once | INTRAMUSCULAR | Status: AC | PRN
  Administered 2021-12-06: 2.5 mg via INTRAVENOUS
  Filled 2021-12-06: qty 1

## 2021-12-06 MED ORDER — LACTATED RINGERS IV BOLUS
1000.0000 mL | Freq: Once | INTRAVENOUS | Status: AC
  Administered 2021-12-06: 1000 mL via INTRAVENOUS

## 2021-12-06 MED ORDER — ACETAMINOPHEN 650 MG RE SUPP: 650.0000 mg | Freq: Four times a day (QID) | RECTAL | Status: DC | PRN

## 2021-12-06 MED ORDER — SODIUM CHLORIDE 0.45 % IV SOLN: INTRAVENOUS | Status: AC

## 2021-12-06 MED ORDER — ONDANSETRON HCL 4 MG PO TABS: 4.0000 mg | ORAL_TABLET | Freq: Four times a day (QID) | ORAL | Status: DC | PRN

## 2021-12-06 MED ORDER — HALOPERIDOL LACTATE 5 MG/ML IJ SOLN
2.5000 mg | Freq: Once | INTRAMUSCULAR | Status: AC
  Administered 2021-12-06: 2.5 mg via INTRAVENOUS
  Filled 2021-12-06: qty 1

## 2021-12-06 MED ORDER — ACETAMINOPHEN 325 MG PO TABS: 650.0000 mg | ORAL_TABLET | Freq: Four times a day (QID) | ORAL | Status: DC | PRN

## 2021-12-06 NOTE — ED Triage Notes (Addendum)
Patient's daughter reports that the patient has dementia and not eating for the past few weeks.  Patient lives by himself.  Patient had a fall 4 days ago and hit the back of his head. Patient does not take any blood thinners.  Patient c/o feeling weak.

## 2021-12-06 NOTE — H&P (Signed)
History and Physical    Patient: Bradley Meza WUJ:[id number] DOB: [birth year] DOA: 12/06/2021 DOS: the patient was seen and examined on 12/06/2021 PCP: Hoyt Koch, MD  Patient coming from: Home  Chief Complaint:  Chief Complaint  Patient presents with   Weakness   Fall   Head Injury   Dementia   HPI: Bradley Meza is a 88 y.o. male with medical history significant of seasonal allergies, COPD, dementia with behavioral disturbance, arthritis, GERD, hyperlipidemia, chronic lower back pain, prostate cancer, renal cysts who is brought to the emergency department due to generalized weakness, decreased appetite and recent fall with occipital head injury.  He lives by himself, but his daughter checks on him twice a day.  After he had his fall, he declined to come to the hospital according to his daughter.  He has lost some weight in the past few months.  He is not taking medications at the moment.  No complaints of fever, productive cough, respiratory distress, chest or abdominal pain, nausea, emesis, diarrhea, constipation, melena or hematochezia.    ED course: Initial vital signs were temperature 99.2 F, pulse 99, respirations 16, BP 137/93 mmHg O2 sat 99% on room air.  The patient received Haldol 2.5 mg IV and 1000 mL of LR bolus.  Lab work: CBC showed a white count of 176.9, hemoglobin 10.5 g/dL platelets 91.  Sodium 144, potassium 5.9, chloride 112 and CO2 21 mmol/L with an anion gap of 11.  Glucose 148, BUN 43 and creatinine 2.72 mg/dL.  His renal function baseline usually between 1.2 and 1.4 mg/dL.   Review of Systems: As mentioned in the history of present illness. All other systems reviewed and are negative. Past Medical History:  Diagnosis Date   Allergy history unknown    Blood donor, other    COPD (chronic obstructive pulmonary disease) (HCC)    Dementia (HCC)    Diverticulosis of colon    DJD (degenerative joint disease)    GERD (gastroesophageal reflux disease)     Hypercholesteremia    Lumbar back pain    Prostate cancer ([place])    Renal cyst    Past Surgical History:  Procedure Laterality Date   RIH repair     Social History:  reports that he quit smoking about 33 years ago. His smoking use included cigarettes. He has quit using smokeless tobacco. He reports that he does not currently use alcohol. He reports that he does not use drugs.  No Known Allergies  Family History  Problem Relation Age of Onset   Arthritis Mother     Prior to Admission medications   Medication Sig Start Date End Date Taking? Authorizing Provider  aspirin 81 MG tablet Take 81 mg by mouth daily.    [provider]  cholecalciferol (VITAMIN D) 1000 UNITS tablet Take 1,000 Units by mouth daily.    [provider]  Multiple Vitamins-Minerals (CENTRUM SILVER PO) Take 1 tablet by mouth daily.    [provider]    Physical Exam: Vitals:   12/06/21 1130 12/06/21 1200 12/06/21 1300 12/06/21 1330  BP: 98/60 131/70 131/70 128/68  Pulse: (!) 56 (!) 59 60 62  Resp: '16 16 16 16  '$ Temp:      TempSrc:      SpO2: 100% 98% 98% 98%  Weight:      Height:       Physical Exam Vitals and nursing note reviewed.  Constitutional:      General: He is sleeping. He is  not in acute distress.    Appearance: Normal appearance. He is normal weight.     Interventions: He is sedated.  HENT:     Head: Normocephalic.     Mouth/Throat:     Mouth: Mucous membranes are dry.  Eyes:     General: No scleral icterus.    Pupils: Pupils are equal, round, and reactive to light.  Neck:     Vascular: No JVD.  Cardiovascular:     Rate and Rhythm: Normal rate and regular rhythm.     Heart sounds: S1 normal and S2 normal.  Pulmonary:     Effort: Pulmonary effort is normal.     Breath sounds: Normal breath sounds. No wheezing, rhonchi or rales.  Abdominal:     General: Bowel sounds are normal. There is no distension.     Palpations: Abdomen is soft.     Tenderness:  There is no abdominal tenderness. There is no right CVA tenderness, left CVA tenderness or guarding.  Musculoskeletal:     Cervical back: Neck supple.     Right lower leg: No edema.     Left lower leg: No edema.  Skin:    General: Skin is warm and dry.  Neurological:     General: No focal deficit present.  Psychiatric:     Comments: Sedated with haloperidol.  Unable to fully evaluate.    Data Reviewed:  There are no new results to review at this time.  Assessment and Plan: Principal Problem:   AKI (acute kidney injury) ([place]) Observation/telemetry. Continue IV fluids. Avoid hypotension. Avoid nephrotoxins. Monitor intake and output. Monitor renal function electrolytes.  Active Problems:   Hyperkalemia In the setting of AKI. Continue IV fluids. Follow-up potassium level.    Dementia with behavioral disturbance ([place]) Start risperidone at bedtime. Continue haloperidol 2.5 mg IVP as needed.    CLL (chronic lymphocytic leukemia) [facility]) Hematology/oncology evaluation deferred per family request. Palliative care consult has been requested.    Macrocytic anemia Follow hemoglobin/hematocrit.     Advance Care Planning:   Code Status: DNR   Consults: Palliative care consult requested.  Family Communication: I spoke to his daughter Bradley Meza.  Severity of Illness: The appropriate patient status for this patient is OBSERVATION. Observation status is judged to be reasonable and necessary in order to provide the required intensity of service to ensure the patient's safety. The patient's presenting symptoms, physical exam findings, and initial radiographic and laboratory data in the context of their medical condition is felt to place them at decreased risk for further clinical deterioration. Furthermore, it is anticipated that the patient will be medically stable for discharge from the hospital within 2 midnights of admission.   Author: Reubin Milan, MD 12/06/2021  2:38 PM  For on call review [email or web address].   This document was prepared using Dragon voice recognition software and may contain some unintended transcription errors.

## 2021-12-06 NOTE — ED Notes (Signed)
Water and crackers given to pt.

## 2021-12-06 NOTE — ED Notes (Signed)
Pt plus family member is aware that a urine sample is needed, pt has dementia but family member states he knows when he needs to go. Urinal left at bedside.

## 2021-12-07 DIAGNOSIS — K219 Gastro-esophageal reflux disease without esophagitis: Secondary | ICD-10-CM | POA: Diagnosis present

## 2021-12-07 DIAGNOSIS — J449 Chronic obstructive pulmonary disease, unspecified: Secondary | ICD-10-CM | POA: Diagnosis present

## 2021-12-07 DIAGNOSIS — C9110 Chronic lymphocytic leukemia of B-cell type not having achieved remission: Secondary | ICD-10-CM | POA: Diagnosis not present

## 2021-12-07 DIAGNOSIS — N1831 Chronic kidney disease, stage 3a: Secondary | ICD-10-CM | POA: Diagnosis present

## 2021-12-07 DIAGNOSIS — E875 Hyperkalemia: Secondary | ICD-10-CM | POA: Diagnosis not present

## 2021-12-07 DIAGNOSIS — Z87891 Personal history of nicotine dependence: Secondary | ICD-10-CM | POA: Diagnosis not present

## 2021-12-07 DIAGNOSIS — M545 Low back pain, unspecified: Secondary | ICD-10-CM | POA: Diagnosis present

## 2021-12-07 DIAGNOSIS — D539 Nutritional anemia, unspecified: Secondary | ICD-10-CM | POA: Diagnosis present

## 2021-12-07 DIAGNOSIS — E872 Acidosis, unspecified: Secondary | ICD-10-CM | POA: Diagnosis present

## 2021-12-07 DIAGNOSIS — Z8546 Personal history of malignant neoplasm of prostate: Secondary | ICD-10-CM | POA: Diagnosis not present

## 2021-12-07 DIAGNOSIS — Z66 Do not resuscitate: Secondary | ICD-10-CM

## 2021-12-07 DIAGNOSIS — F03918 Unspecified dementia, unspecified severity, with other behavioral disturbance: Secondary | ICD-10-CM | POA: Diagnosis not present

## 2021-12-07 DIAGNOSIS — Z7982 Long term (current) use of aspirin: Secondary | ICD-10-CM | POA: Diagnosis not present

## 2021-12-07 DIAGNOSIS — E7800 Pure hypercholesterolemia, unspecified: Secondary | ICD-10-CM | POA: Diagnosis present

## 2021-12-07 DIAGNOSIS — Z515 Encounter for palliative care: Secondary | ICD-10-CM

## 2021-12-07 DIAGNOSIS — F03911 Unspecified dementia, unspecified severity, with agitation: Secondary | ICD-10-CM | POA: Diagnosis present

## 2021-12-07 DIAGNOSIS — G8929 Other chronic pain: Secondary | ICD-10-CM | POA: Diagnosis present

## 2021-12-07 DIAGNOSIS — N179 Acute kidney failure, unspecified: Secondary | ICD-10-CM | POA: Diagnosis not present

## 2021-12-07 LAB — COMPREHENSIVE METABOLIC PANEL
ALT: 14 U/L (ref 0–44)
AST: 23 U/L (ref 15–41)
Albumin: 3.6 g/dL (ref 3.5–5.0)
Alkaline Phosphatase: 73 U/L (ref 38–126)
Anion gap: 8 (ref 5–15)
BUN: 42 mg/dL — ABNORMAL HIGH (ref 8–23)
CO2: 24 mmol/L (ref 22–32)
Calcium: 8.8 mg/dL — ABNORMAL LOW (ref 8.9–10.3)
Chloride: 107 mmol/L (ref 98–111)
Creatinine, Ser: 1.96 mg/dL — ABNORMAL HIGH (ref 0.61–1.24)
GFR, Estimated: 31 mL/min — ABNORMAL LOW (ref >60)
Glucose, Bld: 108 mg/dL — ABNORMAL HIGH (ref 70–99)
Potassium: 4.3 mmol/L (ref 3.5–5.1)
Sodium: 139 mmol/L (ref 135–145)
Total Bilirubin: 1.1 mg/dL (ref 0.3–1.2)
Total Protein: 6.4 g/dL — ABNORMAL LOW (ref 6.5–8.1)

## 2021-12-07 LAB — URINALYSIS, ROUTINE W REFLEX MICROSCOPIC
Bilirubin Urine: NEGATIVE
Glucose, UA: NEGATIVE mg/dL
Ketones, ur: NEGATIVE mg/dL
Leukocytes,Ua: NEGATIVE
Nitrite: NEGATIVE
Protein, ur: 30 mg/dL — AB
RBC / HPF: >50 RBC/hpf — ABNORMAL HIGH (ref 0–5)
Specific Gravity, Urine: 1.018 (ref 1.005–1.030)
pH: 5 (ref 5.0–8.0)

## 2021-12-07 LAB — CBC
HCT: 32.6 % — ABNORMAL LOW (ref 39.0–52.0)
Hemoglobin: 9.4 g/dL — ABNORMAL LOW (ref 13.0–17.0)
MCH: 31.9 pg (ref 26.0–34.0)
MCHC: 28.8 g/dL — ABNORMAL LOW (ref 30.0–36.0)
MCV: 110.5 fL — ABNORMAL HIGH (ref 80.0–100.0)
Platelets: 73 10*3/uL — ABNORMAL LOW (ref 150–400)
RBC: 2.95 MIL/uL — ABNORMAL LOW (ref 4.22–5.81)
RDW: 16.1 % — ABNORMAL HIGH (ref 11.5–15.5)
WBC: 133.1 10*3/uL (ref 4.0–10.5)
nRBC: 0 % (ref 0.0–0.2)

## 2021-12-07 MED ORDER — HALOPERIDOL LACTATE 5 MG/ML IJ SOLN
2.5000 mg | Freq: Once | INTRAMUSCULAR | Status: AC | PRN
  Administered 2021-12-08: 2.5 mg via INTRAMUSCULAR
  Filled 2021-12-07: qty 1

## 2021-12-07 NOTE — Plan of Care (Signed)
  Problem: Education: Goal: Knowledge of General Education information will improve Description: Including pain rating scale, medication(s)/side effects and non-pharmacologic comfort measures Outcome: Not Progressing   Problem: Health Behavior/Discharge Planning: Goal: Ability to manage health-related needs will improve Outcome: Not Progressing   Problem: Clinical Measurements: Goal: Ability to maintain clinical measurements within normal limits will improve Outcome: Progressing Goal: Will remain free from infection Outcome: Progressing Goal: Diagnostic test results will improve Outcome: Progressing Goal: Respiratory complications will improve Outcome: Progressing Goal: Cardiovascular complication will be avoided Outcome: Progressing   Problem: Activity: Goal: Risk for activity intolerance will decrease Outcome: Progressing   Problem: Nutrition: Goal: Adequate nutrition will be maintained Outcome: Progressing   Problem: Coping: Goal: Level of anxiety will decrease Outcome: Not Progressing   Problem: Elimination: Goal: Will not experience complications related to bowel motility Outcome: Progressing Goal: Will not experience complications related to urinary retention Outcome: Progressing   Problem: Pain Managment: Goal: General experience of comfort will improve Outcome: Progressing   Problem: Safety: Goal: Ability to remain free from injury will improve Outcome: Progressing   Problem: Skin Integrity: Goal: Risk for impaired skin integrity will decrease Outcome: Progressing

## 2021-12-07 NOTE — Progress Notes (Signed)
IV infiltrated, taken out. IV team notified. Warm compress placed.

## 2021-12-07 NOTE — Consult Note (Signed)
Consultation Note Date: 12/07/2021   Patient Name: Bradley Meza  DOB: 1924-02-28  MRN: [id number]  Age / Sex: 88 y.o., male  PCP: Hoyt Koch, MD Referring Physician: Antonieta Pert, MD  Reason for Consultation: Establishing goals of care  HPI/Patient Profile: 88 y.o. male  with past medical history of CLL (2021 diagnosis), COPD, dementia, arthritis, GERD, HLD, chronic low back pain, prostate cancer, and renal cysts, and recent fall at home admitted on 12/06/2021 with decreased p.o. intake, weakness status post fall, and progression of dementia.  During course of hospitalization, CT and ultrasound of kidneys have been declined by family.  As per chart review, family wishes to pursue palliative measures.  Palliative medicine team was consulted to discuss goals of care..   Clinical Assessment and Goals of Care: I have reviewed medical records including EPIC notes, labs and imaging, assessed the patient and then met with patient's daughter [name] to discuss diagnosis prognosis, [place], EOL wishes, disposition and options.  [name] is preparing to leave and will return tomorrow between 10 AM and 12 PM.  I introduced Palliative Medicine as specialized medical care for people living with serious illness. It focuses on providing relief from the symptoms and stress of a serious illness. The goal is to improve quality of life for both the patient and the family.  I attempted to elicit goals and wishes important to the patient and family.  [name] shared she believes patient is ready for palliative services.  I clarified the difference between palliative and hospice services.  [name] then shared that she is interested to know what options her father has for hospice care.  We briefly discussed evaluating patient for hospice eligibility.   Discussed with [name] the importance of continued conversation with family and the  medical providers regarding overall plan of care and treatment options, ensuring decisions are within the context of the patient's values and GOCs.    Questions and concerns were addressed.  [name] plans to be present at bedside and would like to speak with a provider regarding hospice options tomorrow between 10 AM and 12 PM. PMT will continue to follow. TOC consult placed to assist with hospice options.   Primary Decision Maker NEXT OF KIN  Code Status/Advance Care Planning: DNR  Prognosis:   < 2 weeks  Discharge Planning: Hospice home vs hospice at home  Primary Diagnoses: Present on Admission:  AKI (acute kidney injury) ([place])  CLL (chronic lymphocytic leukemia) ([place])  Dementia with behavioral disturbance (HCC)  Hyperkalemia  Macrocytic anemia   Physical Exam Vitals reviewed.  Constitutional:      General: He is not in acute distress.    Appearance: He is not ill-appearing.  HENT:     Head: Normocephalic.  Eyes:     Pupils: Pupils are equal, round, and reactive to light.  Cardiovascular:     Rate and Rhythm: Normal rate.     Pulses: Normal pulses.  Pulmonary:     Effort: Pulmonary effort is normal.  Abdominal:     Palpations:  Abdomen is soft.  Musculoskeletal:     Comments: Generalized weakness  Skin:    General: Skin is warm and dry.  Neurological:     Mental Status: He is alert.     Comments: Oriented to self  Psychiatric:        Mood and Affect: Mood normal.     Palliative Assessment/Data: 30%     Thank you for this consult. Palliative medicine will continue to follow and assist holistically.   Time Total: 40 minutes Greater than 50%  of this time was spent counseling and coordinating care related to the above assessment and plan.  Signed by: Jordan Hawks, DNP, FNP-BC Palliative Medicine    Please contact Palliative Medicine Team phone at [phone] for questions and concerns.  For individual provider: See Shea Evans

## 2021-12-07 NOTE — ED Provider Notes (Signed)
[facility] Provider Note   CSN: [id number] Arrival date & time: 12/06/21  3382     History  Chief Complaint  Patient presents with   Weakness   Fall   Head Injury   Dementia    Bradley Meza is a 88 y.o. male.  HPI     88 y.o. male with medical history significant of COPD, dementia with behavioral disturbance, arthritis, GERD, hyperlipidemia, chronic lower back pain, prostate cancer, renal cysts who is brought to the emergency department due to generalized weakness by his daughter, who is a retired family medicine physician.  According to the patient's daughter, patient lives by himself.  Family visits him regularly.  Over the last few weeks they have noted that his p.o. intake has gone down.  Patient is weaker than usual and has lost some weight.  Today patient himself requested to come to the hospital as he was not feeling well.  Patient unable to articulate what is troubling him at this point.  Per daughter, patient was informed 2 years ago that he might have CLL.  There was no discussion on neck steps at that time.  It was very difficult to get the patient to a physician even at that time.  Daughter indicates that patient had a fall perhaps 2 days ago.  She does not think a CT scan would change anything, as he would not want any intervention.  She does not think he would tolerate a CT scan.  She also informs me that they would not want any cure or treatment for CLL.  Home Medications Prior to Admission medications   Not on File      Allergies    Patient has no known allergies.    Review of Systems   Review of Systems  All other systems reviewed and are negative.   Physical Exam Updated Vital Signs BP 126/72 (BP Location: Left Arm)   Pulse (!) 56   Temp 97.6 F (36.4 C) (Oral)   Resp 20   Ht '5\' 8"'$  (1.727 m)   Wt 63.5 kg   SpO2 91%   BMI 21.29 kg/m  Physical Exam Vitals and nursing note reviewed.  Constitutional:      Appearance: He is  well-developed.  HENT:     Head: Atraumatic.  Cardiovascular:     Rate and Rhythm: Normal rate.  Pulmonary:     Effort: Pulmonary effort is normal.  Musculoskeletal:     Cervical back: Neck supple.  Skin:    General: Skin is warm.  Neurological:     Mental Status: He is alert.  Psychiatric:     Comments: Slightly agitated     ED Results / Procedures / Treatments   Labs (all labs ordered are listed, but only abnormal results are displayed) Labs Reviewed  BASIC METABOLIC PANEL - Abnormal; Notable for the following components:      Result Value   Potassium 5.9 (*)    Chloride 112 (*)    CO2 21 (*)    Glucose, Bld 148 (*)    BUN 43 (*)    Creatinine, Ser 2.72 (*)    GFR, Estimated 21 (*)    All other components within normal limits  CBC - Abnormal; Notable for the following components:   WBC 176.9 (*)    RBC 3.34 (*)    Hemoglobin 10.5 (*)    HCT 36.0 (*)    MCV 107.8 (*)    MCHC 29.2 (*)  RDW 16.9 (*)    Platelets 91 (*)    All other components within normal limits  URINALYSIS, ROUTINE W REFLEX MICROSCOPIC - Abnormal; Notable for the following components:   APPearance CLOUDY (*)    Hgb urine dipstick LARGE (*)    Protein, ur 30 (*)    RBC / HPF >50 (*)    Bacteria, UA FEW (*)    All other components within normal limits  CBC - Abnormal; Notable for the following components:   WBC 133.1 (*)    RBC 2.95 (*)    Hemoglobin 9.4 (*)    HCT 32.6 (*)    MCV 110.5 (*)    MCHC 28.8 (*)    RDW 16.1 (*)    Platelets 73 (*)    All other components within normal limits  COMPREHENSIVE METABOLIC PANEL - Abnormal; Notable for the following components:   Glucose, Bld 108 (*)    BUN 42 (*)    Creatinine, Ser 1.96 (*)    Calcium 8.8 (*)    Total Protein 6.4 (*)    GFR, Estimated 31 (*)    All other components within normal limits  CBG MONITORING, ED - Abnormal; Notable for the following components:   Glucose-Capillary 151 (*)    All other components within normal  limits    EKG EKG Interpretation  Date/Time:  Thursday December 06 2021 09:56:54 EDT Ventricular Rate:  100 PR Interval:  151 QRS Duration: 75 QT Interval:  383 QTC Calculation: 494 R Axis:   68 Text Interpretation: Sinus tachycardia Right atrial enlargement Borderline repolarization abnormality Borderline prolonged QT interval twi in the lateral leads is new Confirmed by Varney Biles [phone]) on 12/06/2021 12:04:42 PM  Radiology No results found.  Procedures .Critical Care  Performed by: Varney Biles, MD Authorized by: Varney Biles, MD   Critical care provider statement:    Critical care time (minutes):  41   Critical care was necessary to treat or prevent imminent or life-threatening deterioration of the following conditions:  Circulatory failure and dehydration   Critical care was time spent personally by me on the following activities:  Development of treatment plan with patient or surrogate, discussions with consultants, evaluation of patient's response to treatment, examination of patient, ordering and review of laboratory studies, ordering and review of radiographic studies, ordering and performing treatments and interventions, pulse oximetry, re-evaluation of patient's condition and review of old charts     Medications Ordered in ED Medications  acetaminophen (TYLENOL) tablet 650 mg (650 mg Oral Patient Refused/Not Given 12/06/21 1611)  acetaminophen (TYLENOL) tablet 650 mg (has no administration in time range)    Or  acetaminophen (TYLENOL) suppository 650 mg (has no administration in time range)  ondansetron (ZOFRAN) tablet 4 mg (has no administration in time range)    Or  ondansetron (ZOFRAN) injection 4 mg (has no administration in time range)  0.45 % sodium chloride infusion ( Intravenous New Bag/Given 12/06/21 1958)  lactated ringers bolus 1,000 mL (0 mLs Intravenous Stopped 12/06/21 1336)  haloperidol lactate (HALDOL) injection 2.5 mg (2.5 mg Intravenous Given  12/06/21 1230)  haloperidol lactate (HALDOL) injection 2.5 mg (2.5 mg Intravenous Given 12/06/21 2006)    ED Course/ Medical Decision Making/ A&P                           Medical Decision Making Amount and/or Complexity of Data Reviewed Labs: ordered.  Risk Prescription drug management. Decision regarding hospitalization.  This patient presents to the ED with chief complaint(s) of generalized weakness with pertinent past medical history of COPD, dementia, possible CLL.The complaint involves an extensive differential diagnosis and also carries with it a high risk of complications and morbidity.    The differential diagnosis includes : Severe dehydration, electrolyte abnormality, renal failure, subdural hematoma due to recent fall, UTI, medication side effects  Basic labs were ordered. Patient was found to have AKI with mild hyperkalemia.  He also has a white count of 177.  There is no clear indication of infection on history alone.  Daughter indicates that patient was informed that he might have CLL in 2021, there were no following steps.  They would not want to pursue treatment.  She has general POA for the patient.  Patient admitted clear that he would not want to prolong his life and he would not want CPR or intubation to her.  CT scan of the brain was discontinued as family does not think they would want anything done even if there is a brain bleed. Ultrasound kidney not ordered as there is a conversation with family about getting hospice involved.  Records reviewed Primary Care Documents  Independent labs interpretation:  The following labs were independently interpreted: Elevated white count, creatinine, potassium   Treatment and Reassessment: Admission to medicine with consult to palliative care As needed Haldol ordered. Final Clinical Impression(s) / ED Diagnoses Final diagnoses:  AKI (acute kidney injury) ([place])  CLL (chronic lymphocytic leukemia) ([place])    Rx / DC  Orders ED Discharge Orders     None         Varney Biles, MD 12/07/21 [phone]

## 2021-12-07 NOTE — TOC Initial Note (Signed)
Transition of Care [facility]) - Initial/Assessment Note    Patient Details  Name: Bradley Meza MRN: [id number] Date of Birth: 1924/02/23  Transition of Care [facility]) CM/SW Contact:    Leeroy Cha, RN Phone Number: 12/07/2021, 7:48 AM  Clinical Narrative:                  Transition of Care [facility]) Screening Note   Patient Details  Name: Bradley Meza Date of Birth: 1924-02-22   Transition of Care [facility]) CM/SW Contact:    Leeroy Cha, RN Phone Number: 12/07/2021, 7:48 AM    Transition of Care Department [facility]) has reviewed patient and no TOC needs have been identified at this time. We will continue to monitor patient advancement through interdisciplinary progression rounds. If new patient transition needs arise, please place a TOC consult.    Expected Discharge Plan: Home/Self Care Barriers to Discharge: Continued Medical Work up   Patient Goals and CMS Choice Patient states their goals for this hospitalization and ongoing recovery are:: to back to the house CMS Medicare.gov Compare Post Acute Care list provided to:: Patient Represenative (must comment) (daughter) Choice offered to / list presented to : Adult Children  Expected Discharge Plan and Services Expected Discharge Plan: Home/Self Care   Discharge Planning Services: CM Consult   Living arrangements for the past 2 months: Single Family Home                                      Prior Living Arrangements/Services Living arrangements for the past 2 months: Single Family Home Lives with:: Self Patient language and need for interpreter reviewed:: Yes Do you feel safe going back to the place where you live?: Yes      Need for Family Participation in Patient Care: Yes (Comment) (daughter)     Criminal Activity/Legal Involvement Pertinent to Current Situation/Hospitalization: No - Comment as needed  Activities of Daily Living Home Assistive Devices/Equipment: None ADL Screening (condition at time of  admission) Patient's cognitive ability adequate to safely complete daily activities?: No Is the patient deaf or have difficulty hearing?: Yes Does the patient have difficulty seeing, even when wearing glasses/contacts?: No Does the patient have difficulty concentrating, remembering, or making decisions?: Yes Patient able to express need for assistance with ADLs?: No Does the patient have difficulty dressing or bathing?: Yes Independently performs ADLs?: No Communication: Independent Dressing (OT): Needs assistance Is this a change from baseline?: Pre-admission baseline Grooming: Needs assistance Is this a change from baseline?: Pre-admission baseline Feeding: Independent Bathing: Needs assistance Is this a change from baseline?: Pre-admission baseline Toileting: Needs assistance Is this a change from baseline?: Change from baseline, expected to last <3 days In/Out Bed: Needs assistance Is this a change from baseline?: Change from baseline, expected to last <3 days Walks in Home: Needs assistance Is this a change from baseline?: Change from baseline, expected to last <3 days Does the patient have difficulty walking or climbing stairs?: Yes Weakness of Legs: Both Weakness of Arms/Hands: None  Permission Sought/Granted                  Emotional Assessment Appearance:: Appears stated age Attitude/Demeanor/Rapport: Engaged Affect (typically observed): Accepting Orientation: : Oriented to Self, Oriented to Place, Oriented to Situation Alcohol / Substance Use: Tobacco Use (quit 33 years ago) Psych Involvement: No (comment)  Admission diagnosis:  AKI (acute kidney injury) ([place]) [N17.9] Patient Active Problem  List   Diagnosis Date Noted   AKI (acute kidney injury) ([place]) 12/06/2021   Hyperkalemia 12/06/2021   Macrocytic anemia 12/06/2021   Dementia with behavioral disturbance ([place]) 06/04/2019   CLL (chronic lymphocytic leukemia) ([place]) 06/04/2019   Aggressive behavior, adult  04/20/2019   Routine general medical examination at a health care facility 03/14/2015   Kidney stone 08/09/2013   PROSTATE CANCER 05/18/2009   PCP:  Hoyt Koch, MD Pharmacy:   [id number] [id number]: [phone]: [phone]    Social Determinants of Health (SDOH) Interventions Food Insecurity Interventions: Patient Refused Utilities Interventions: Patient Refused  Readmission Risk Interventions   No data to display

## 2021-12-07 NOTE — Progress Notes (Signed)
VAST consult received to obtain IV access. Assessed patient's arms bilaterally utilizing ultrasound; no appropriate vessels for USGIV. Unit RN notified.

## 2021-12-07 NOTE — Progress Notes (Signed)
PROGRESS NOTE Bradley Meza  OBS:[id number] DOB: 1923/06/17 DOA: 12/06/2021 PCP: Hoyt Koch, MD   Brief Narrative/Hospital Course: 88 year old with worsening CLL with a WBC of 177,COPD, dementia with behavioral disturbance, arthritis, GERD, hyperlipidemia, chronic lower back pain, prostate cancer, renal cysts who is coming in due to decrease oral intake, generalized weakness, decreased appetite, recent fall and worsening dementia.  Patient is admitted for AKI with creatinine 2.7 baseline 1.2-1.4, worsening WBC count, hyperkalemia metabolic acidosis.  He has been made DNR.  Palliative care has been consulted per daughter's request(daughter is a retired family physician)      Subjective: Seen and examined.  Patient is alert awake oriented to self Not keeping IV line in place has leakage notified nursing staff One of the leg was dangling on the bedside  Assessment and Plan: Principal Problem:   AKI (acute kidney injury) ([place]) Active Problems:   Dementia with behavioral disturbance ([place])   CLL (chronic lymphocytic leukemia) ([place])   Hyperkalemia   Macrocytic anemia   AKI on CKDIIIa: Baseline creatinine ranges 1.2-1.4.  Due to poor oral intake continue IV fluids, avoid nephrotoxic medication ensure oral intake.  Monitor electrolytes and output Recent Labs  Lab 12/06/21 0947 12/07/21 0628  BUN 43* 42*  CREATININE 2.72* 1.96*    Hyperkalemia: Due to AKI.  Resolved Recent Labs  Lab 12/06/21 0947 12/07/21 0628  K 5.9* 4.3    Dementia with behavioral disturbance: Started on risperidone at bedtime, continue Haldol as needed, continue supportive care fall precaution delirium precaution  UTM:LYYT-KPT evaluation deferred per family request and Perative care has been consulted patient is extremely elevated WBC count 177k>133k.  WBC 2 years ago was in 30K range  Macrocytic anemia:Suspect in the setting of CLL/chronic disease.  Hemoglobin stable 9 to 10 g.  Goals of care currently  DNR, palliative care has been consulted.  DVT prophylaxis:SCDs Start: 12/06/21 1422 Code Status:   Code Status: DNR Family Communication: plan of care discussed with patient at bedside. Called his daughter's no-  no answer, called again in her cell no. Patient status is: Remains hospitalized due to ongoing AKI, ongoing discussion regarding goals of care  Level of care:Med-Surg  Dispo: The patient is from:Home by himself- daughter checking on twice a day.            Anticipated disposition:TBD.  Mobility Assessment (last 72 hours)     Mobility Assessment     Row Name 12/07/21 1100 12/06/21 2000 12/06/21 1500       Does patient have an order for bedrest or is patient medically unstable No - Continue assessment No - Continue assessment No - Continue assessment     What is the highest level of mobility based on the progressive mobility assessment? Level 3 (Stands with assist) - Balance while standing  and cannot march in place Level 3 (Stands with assist) - Balance while standing  and cannot march in place Level 3 (Stands with assist) - Balance while standing  and cannot march in place     Is the above level different from baseline mobility prior to current illness? Yes - Recommend PT order Yes - Recommend PT order Yes - Recommend PT order               Objective: Vitals last 24 hrs: Vitals:   12/06/21 1459 12/06/21 1900 12/06/21 2256 12/07/21 0334  BP: (!) 152/87 (!) 151/67 119/68 126/72  Pulse: 87 91 68 (!) 56  Resp: '18 16 18 20  '$ Temp:  97.7 F (36.5 C) (!) 97.5 F (36.4 C) (!) 97.4 F (36.3 C) 97.6 F (36.4 C)  TempSrc: Axillary Oral Oral Oral  SpO2: (!) 76%  (!) 86% 91%  Weight:      Height:       Weight change:   Physical Examination: General exam: alert awake,oriented to self name only HEENT:Oral mucosa moist, Ear/Nose WNL grossly, dentition normal. Respiratory system: bilaterally diminished BS, no use of accessory muscle Cardiovascular system: S1 & S2 +, No  JVD. Gastrointestinal system: Abdomen soft,NT,ND, BS+ Nervous System:Alert, awake, moving extremities and grossly nonfocal Extremities: LE edema neg,distal peripheral pulses palpable.  Skin: No rashes,no icterus. MSK: Normal muscle bulk,tone, power  Medications reviewed:  Scheduled Meds:  acetaminophen  650 mg Oral Once   Continuous Infusions:  sodium chloride 100 mL/hr at 12/06/21 1958      Diet Order             Diet regular Room service appropriate? Yes; Fluid consistency: Thin  Diet effective now                  Intake/Output Summary (Last 24 hours) at 12/07/2021 1112 Last data filed at 12/07/2021 0400 Gross per 24 hour  Intake 934.22 ml  Output 230 ml  Net 704.22 ml   Net IO Since Admission: 704.22 mL [12/07/21 1112]  Wt Readings from Last 3 Encounters:  12/06/21 63.5 kg  06/03/19 67.6 kg  04/20/19 81.6 kg     Unresulted Labs (From admission, onward)    None     Data Reviewed: I have personally reviewed following labs and imaging studies CBC: Recent Labs  Lab 12/06/21 0947 12/07/21 0628  WBC 176.9* 133.1*  HGB 10.5* 9.4*  HCT 36.0* 32.6*  MCV 107.8* 110.5*  PLT 91* 73*   Basic Metabolic Panel: Recent Labs  Lab 12/06/21 0947 12/07/21 0628  NA 144 139  K 5.9* 4.3  CL 112* 107  CO2 21* 24  GLUCOSE 148* 108*  BUN 43* 42*  CREATININE 2.72* 1.96*  CALCIUM 9.6 8.8*   GFR: Estimated Creatinine Clearance: 19.8 mL/min (A) (by C-G formula based on SCr of 1.96 mg/dL (H)). Liver Function Tests: Recent Labs  Lab 12/07/21 0628  AST 23  ALT 14  ALKPHOS 73  BILITOT 1.1  PROT 6.4*  ALBUMIN 3.6   No results for input(s): "LIPASE", "AMYLASE" in the last 168 hours. No results for input(s): "AMMONIA" in the last 168 hours. Coagulation Profile: No results for input(s): "INR", "PROTIME" in the last 168 hours. BNP (last 3 results) No results for input(s): "PROBNP" in the last 8760 hours. HbA1C: No results for input(s): "HGBA1C" in the last 72  hours. CBG: Recent Labs  Lab 12/06/21 1001  GLUCAP 151*   Lipid Profile: No results for input(s): "CHOL", "HDL", "LDLCALC", "TRIG", "CHOLHDL", "LDLDIRECT" in the last 72 hours. Thyroid Function Tests: No results for input(s): "TSH", "T4TOTAL", "FREET4", "T3FREE", "THYROIDAB" in the last 72 hours. Sepsis Labs: No results for input(s): "PROCALCITON", "LATICACIDVEN" in the last 168 hours.  No results found for this or any previous visit (from the past 240 hour(s)).  Antimicrobials: Anti-infectives (From admission, onward)    None      Culture/Microbiology No results found for: "SDES", "SPECREQUEST", "CULT", "REPTSTATUS"  Other culture-see note  Radiology Studies: No results found.   LOS: 0 days   Antonieta Pert, MD [facility]  12/07/2021, 11:12 AM

## 2021-12-07 NOTE — Hospital Course (Addendum)
88 year old with worsening CLL with a WBC of 177,COPD, dementia with behavioral disturbance, arthritis, GERD, hyperlipidemia, chronic lower back pain, prostate cancer, renal cysts who is coming in due to decrease oral intake, generalized weakness, decreased appetite, recent fall and worsening dementia.  Patient is admitted for AKI with creatinine 2.7 baseline 1.2-1.4, worsening WBC count, hyperkalemia metabolic acidosis.  He has been made DNR.  Palliative care has been consulted per daughter's request(daughter is a retired family physician).  Patient continues to have agitation with dementia oriented to self only.  Medication adjusted, family meeting held with palliative care and patient's daughter and they elected to pursue inpatient hospice and referred.  He has been accepted to hospice, will be discharged once bed available.

## 2021-12-08 DIAGNOSIS — N179 Acute kidney failure, unspecified: Secondary | ICD-10-CM | POA: Diagnosis not present

## 2021-12-08 MED ORDER — OLANZAPINE 10 MG IM SOLR
5.0000 mg | INTRAMUSCULAR | Status: AC
  Administered 2021-12-08: 5 mg via INTRAMUSCULAR
  Filled 2021-12-08: qty 10

## 2021-12-08 MED ORDER — HALOPERIDOL LACTATE 5 MG/ML IJ SOLN
5.0000 mg | Freq: Four times a day (QID) | INTRAMUSCULAR | Status: DC | PRN
  Administered 2021-12-08: 5 mg via INTRAMUSCULAR
  Filled 2021-12-08: qty 1

## 2021-12-08 MED ORDER — STERILE WATER FOR INJECTION IJ SOLN
INTRAMUSCULAR | Status: AC
  Filled 2021-12-08: qty 10

## 2021-12-08 MED ORDER — HALOPERIDOL LACTATE 5 MG/ML IJ SOLN
5.0000 mg | Freq: Once | INTRAMUSCULAR | Status: AC
  Administered 2021-12-08: 5 mg via INTRAMUSCULAR
  Filled 2021-12-08: qty 1

## 2021-12-08 MED ORDER — OLANZAPINE 10 MG IM SOLR
2.5000 mg | Freq: Once | INTRAMUSCULAR | Status: AC | PRN
  Administered 2021-12-08: 2.5 mg via INTRAMUSCULAR
  Filled 2021-12-08: qty 10

## 2021-12-08 MED ORDER — HALOPERIDOL 5 MG PO TABS: 5.0000 mg | ORAL_TABLET | Freq: Four times a day (QID) | ORAL | Status: DC | PRN

## 2021-12-08 NOTE — Progress Notes (Signed)
Daily Progress Note   Patient Name: Bradley Meza       Date: 12/08/2021 DOB: [birth year]  Age: 88 y.o. MRN#: [id number] Attending Physician: Antonieta Pert, MD Primary Care Physician: Hoyt Koch, MD Admit Date: 12/06/2021  Reason for Consultation/Follow-up: Establishing goals of care  Subjective: Patient is resting in bed, is confused and did not rest well overnight, daughter arrived at bedside and goals of care meeting happened, see below.  Length of Stay: 1  Current Medications: Scheduled Meds:   acetaminophen  650 mg Oral Once    Continuous Infusions:   PRN Meds: acetaminophen **OR** acetaminophen, ondansetron **OR** ondansetron (ZOFRAN) IV  Physical Exam         Elderly gentleman Confused and agitated Safety sitter at bedside  Vital Signs: BP (!) 152/58 (BP Location: Right Arm)   Pulse 92   Temp 97.7 F (36.5 C) (Oral)   Resp 18   Ht '5\' 8"'$  (1.727 m)   Wt 63.5 kg   SpO2 95%   BMI 21.29 kg/m  SpO2: SpO2: 95 % O2 Device: O2 Device: Room Air O2 Flow Rate:    Intake/output summary:  Intake/Output Summary (Last 24 hours) at 12/08/2021 1211 Last data filed at 12/08/2021 1108 Gross per 24 hour  Intake 0 ml  Output 800 ml  Net -800 ml   LBM:   Baseline Weight: Weight: 63.5 kg Most recent weight: Weight: 63.5 kg       Palliative Assessment/Data:      Patient Active Problem List   Diagnosis Date Noted   AKI (acute kidney injury) ([place]) 12/06/2021   Hyperkalemia 12/06/2021   Macrocytic anemia 12/06/2021   Dementia with behavioral disturbance ([place]) 06/04/2019   CLL (chronic lymphocytic leukemia) ([place]) 06/04/2019   Aggressive behavior, adult 04/20/2019   Routine general medical examination at a health care facility 03/14/2015   Kidney stone 08/09/2013    PROSTATE CANCER 05/18/2009    Palliative Care Assessment & Plan   Patient Profile:   Assessment:  88 y.o. male  with past medical history of CLL (2021 diagnosis), COPD, dementia, arthritis, GERD, HLD, chronic low back pain, prostate cancer, and renal cysts, and recent fall at home admitted on 12/06/2021 with decreased p.o. intake, weakness status post fall, and progression of dementia.   During course of hospitalization, CT and ultrasound of  kidneys have been declined by family.  As per chart review, family wishes to pursue palliative measures.   Palliative medicine team was consulted to discuss goals of care..   Recommendations/Plan: Goals of care discussions undertaken, end-of-life wishes attempted to be explored with daughter [name] who arrived at the bedside.  Discussed about hospice philosophy, specifically about residential hospice. Plan: Residential hospice, choices discussed, [facility].  Will request TOC assistance.    Code Status:    Code Status Orders  (From admission, onward)           Start     Ordered   12/06/21 1421  Do not attempt resuscitation (DNR)  Continuous       Question Answer Comment  In the event of cardiac or respiratory ARREST Do not call a "code blue"   In the event of cardiac or respiratory ARREST Do not perform Intubation, CPR, defibrillation or ACLS   In the event of cardiac or respiratory ARREST Use medication by any route, position, wound care, and other measures to relive pain and suffering. May use oxygen, suction and manual treatment of airway obstruction as needed for comfort.   Comments dnr and dni per daughter      12/06/21 1421           Code Status History     Date Active Date Inactive Code Status Order ID Comments User Context   12/06/2021 1336 12/06/2021 1421 DNR [id number]  Varney Biles, MD ED       Prognosis:  < 2 weeks  Discharge Planning: Hospice facility  Care plan was discussed with daughter Dr Leward Quan.    Thank you for allowing the Palliative Medicine Team to assist in the care of this patient.  MOD MDM.      Greater than 50%  of this time was spent counseling and coordinating care related to the above assessment and plan.  Loistine Chance, MD  Please contact Palliative Medicine Team phone at [phone] for questions and concerns.

## 2021-12-08 NOTE — Plan of Care (Signed)
  Problem: Education: Goal: Knowledge of General Education information will improve Description: Including pain rating scale, medication(s)/side effects and non-pharmacologic comfort measures Outcome: Not Progressing   Problem: Health Behavior/Discharge Planning: Goal: Ability to manage health-related needs will improve Outcome: Not Progressing   Problem: Clinical Measurements: Goal: Ability to maintain clinical measurements within normal limits will improve Outcome: Progressing Goal: Will remain free from infection Outcome: Progressing Goal: Diagnostic test results will improve Outcome: Not Progressing Goal: Respiratory complications will improve Outcome: Progressing Goal: Cardiovascular complication will be avoided Outcome: Progressing   Problem: Nutrition: Goal: Adequate nutrition will be maintained Outcome: Progressing   Problem: Coping: Goal: Level of anxiety will decrease Outcome: Not Progressing   Problem: Elimination: Goal: Will not experience complications related to bowel motility Outcome: Progressing Goal: Will not experience complications related to urinary retention Outcome: Progressing   Problem: Pain Managment: Goal: General experience of comfort will improve Outcome: Progressing   Problem: Safety: Goal: Ability to remain free from injury will improve Outcome: Progressing   Problem: Skin Integrity: Goal: Risk for impaired skin integrity will decrease Outcome: Progressing

## 2021-12-08 NOTE — Progress Notes (Signed)
This CSW informed Judson Roch with [facility] that the family is interested in [facility] following.

## 2021-12-08 NOTE — Progress Notes (Signed)
PROGRESS NOTE Bradley Meza  VEL:[id number] DOB: 08-10-1923 DOA: 12/06/2021 PCP: Hoyt Koch, MD   Brief Narrative/Hospital Course: 88 year old with worsening CLL with a WBC of 177,COPD, dementia with behavioral disturbance, arthritis, GERD, hyperlipidemia, chronic lower back pain, prostate cancer, renal cysts who is coming in due to decrease oral intake, generalized weakness, decreased appetite, recent fall and worsening dementia.  Patient is admitted for AKI with creatinine 2.7 baseline 1.2-1.4, worsening WBC count, hyperkalemia metabolic acidosis.  He has been made DNR.  Palliative care has been consulted per daughter's request(daughter is a retired family physician)    Subjective: Seen and examined this morning multiple times Patient has sedated overnight given dose of Haldol, sitter order placed. RN reports he has been trying to walk out Multiple staff in the room trying to calm him down Later daughter arrived and I spoke with her at the bedside Patient had been agitated and violent  Assessment and Plan: Principal Problem:   AKI (acute kidney injury) ([place]) Active Problems:   Dementia with behavioral disturbance ([place])   CLL (chronic lymphocytic leukemia) ([place])   Hyperkalemia   Macrocytic anemia   AKI on CKDIIIa: Baseline creatinine ranges 1.2-1.4.  Due to poor oral intake-treated with IV fluids creatinine 1.9,repeat labs.Monitor electrolytes and urine output.  Patient is refusing any more IV. Recent Labs  Lab 12/06/21 0947 12/07/21 0628  BUN 43* 42*  CREATININE 2.72* 1.96*    Hyperkalemia: Due to AKI.  Resolved Recent Labs  Lab 12/06/21 0947 12/07/21 0628  K 5.9* 4.3    Dementia with behavioral disturbance with agitation: Cont fall precaution delirium precaution. Seroquel trial bedtime but is not taking orally.  Given his agitation tried Haldol 2.5 mg during the night and Zyprexa 1.5 mg without improvement.  Hold dose Haldol again consider Geodon if remains very  agitated.palliative to meet  CHE:NIDP-OEU evaluation deferred per family request and Perative care has been consulted patient is extremely elevated WBC count 177k>133k.  WBC 2 years ago was in 30K range Recent Labs  Lab 12/06/21 0947 12/07/21 0628  WBC 176.9* 133.1*    Macrocytic anemia:Suspect in the setting of CLL/chronic disease.  Hemoglobin stable 9 to 10 g.  Goals of care currently DNR, palliative care has been consulted-ongoing discussions regarding hospice await further family meeting today.  DVT prophylaxis:SCDs Start: 12/06/21 1422 Code Status:   Code Status: DNR Family Communication: plan of care discussed with patient at bedside.  Daughter updated at bedside.   Patient status is: Remains hospitalized due to ongoing AKI,ongoing discussion regarding goals of care  Level of care:Med-Surg  Dispo: The patient is from:Home by himself- daughter checking on twice a day.            Anticipated disposition:TBD.  Mobility Assessment (last 72 hours)     Mobility Assessment     Row Name 12/07/21 2012 12/07/21 1100 12/06/21 2000 12/06/21 1500     Does patient have an order for bedrest or is patient medically unstable No - Continue assessment No - Continue assessment No - Continue assessment No - Continue assessment    What is the highest level of mobility based on the progressive mobility assessment? Level 3 (Stands with assist) - Balance while standing  and cannot march in place Level 3 (Stands with assist) - Balance while standing  and cannot march in place Level 3 (Stands with assist) - Balance while standing  and cannot march in place Level 3 (Stands with assist) - Balance while standing  and cannot march  in place    Is the above level different from baseline mobility prior to current illness? Yes - Recommend PT order Yes - Recommend PT order Yes - Recommend PT order Yes - Recommend PT order              Objective: Vitals last 24 hrs: Vitals:   12/07/21 0334 12/07/21 1325  12/07/21 1935 12/08/21 0553  BP: 126/72 139/71 (!) 146/75 (!) 152/58  Pulse: (!) 56 65 75 92  Resp: '20 16 16 18  '$ Temp: 97.6 F (36.4 C)  98.1 F (36.7 C) 97.7 F (36.5 C)  TempSrc: Oral  Oral Oral  SpO2: 91% 96% 96% 95%  Weight:      Height:       Weight change:   Physical Examination: General exam: AAOX1, agitated HEENT:Oral mucosa moist, Ear/Nose WNL grossly, dentition normal. Respiratory system: bilaterally clear,no use of accessory muscle Cardiovascular system: S1 & S2 +, No JVD,. Gastrointestinal system: Abdomen soft,NT,ND,BS+ Nervous System:Alert, awake, moving extremities and grossly nonfocal Extremities: LE ankle edema NEG, distal peripheral pulses palpable.  Skin: No rashes,no icterus. MSK: Normal muscle bulk,tone, power   Medications reviewed:  Scheduled Meds:  acetaminophen  650 mg Oral Once  Continuous Infusions:    Diet Order             Diet full liquid Room service appropriate? Yes; Fluid consistency: Thin  Diet effective now                  Intake/Output Summary (Last 24 hours) at 12/08/2021 1114 Last data filed at 12/08/2021 1108 Gross per 24 hour  Intake 0 ml  Output 800 ml  Net -800 ml   Net IO Since Admission: -95.78 mL [12/08/21 1114]  Wt Readings from Last 3 Encounters:  12/06/21 63.5 kg  06/03/19 67.6 kg  04/20/19 81.6 kg     Unresulted Labs (From admission, onward)    None     Data Reviewed: I have personally reviewed following labs and imaging studies CBC: Recent Labs  Lab 12/06/21 0947 12/07/21 0628  WBC 176.9* 133.1*  HGB 10.5* 9.4*  HCT 36.0* 32.6*  MCV 107.8* 110.5*  PLT 91* 73*   Basic Metabolic Panel: Recent Labs  Lab 12/06/21 0947 12/07/21 0628  NA 144 139  K 5.9* 4.3  CL 112* 107  CO2 21* 24  GLUCOSE 148* 108*  BUN 43* 42*  CREATININE 2.72* 1.96*  CALCIUM 9.6 8.8*   GFR: Estimated Creatinine Clearance: 19.8 mL/min (A) (by C-G formula based on SCr of 1.96 mg/dL (H)). Liver Function Tests: Recent  Labs  Lab 12/07/21 0628  AST 23  ALT 14  ALKPHOS 73  BILITOT 1.1  PROT 6.4*  ALBUMIN 3.6   No results for input(s): "LIPASE", "AMYLASE" in the last 168 hours. No results for input(s): "AMMONIA" in the last 168 hours. Coagulation Profile: No results for input(s): "INR", "PROTIME" in the last 168 hours. BNP (last 3 results) No results for input(s): "PROBNP" in the last 8760 hours. HbA1C: No results for input(s): "HGBA1C" in the last 72 hours. CBG: Recent Labs  Lab 12/06/21 1001  GLUCAP 151*   Lipid Profile: No results for input(s): "CHOL", "HDL", "LDLCALC", "TRIG", "CHOLHDL", "LDLDIRECT" in the last 72 hours. Thyroid Function Tests: No results for input(s): "TSH", "T4TOTAL", "FREET4", "T3FREE", "THYROIDAB" in the last 72 hours. Sepsis Labs: No results for input(s): "PROCALCITON", "LATICACIDVEN" in the last 168 hours.  No results found for this or any previous visit (from the past  240 hour(s)).  Antimicrobials: Anti-infectives (From admission, onward)    None      Culture/Microbiology No results found for: "SDES", "SPECREQUEST", "CULT", "REPTSTATUS"  Other culture-see note  Radiology Studies: No results found.   LOS: 1 day   Antonieta Pert, MD [facility]  12/08/2021, 11:14 AM

## 2021-12-08 NOTE — Progress Notes (Signed)
[id number] Insurance risk surveyor [facility]) Hospital Liaison Note  Received request from Ulysees Barns, [facility] for family interest in [facility]. Spoke with patient's daughter, [name], to confirm interest and explain services. Hospice eligibility pending.    Hospital liaison will follow up tomorrow.    TOC aware.  Please do not hesitate to call with questions,   Thank you, Zigmund Gottron RN  [facility] Liaison [phone]

## 2021-12-09 DIAGNOSIS — N179 Acute kidney failure, unspecified: Secondary | ICD-10-CM | POA: Diagnosis not present

## 2021-12-09 MED ORDER — HALOPERIDOL 5 MG PO TABS: 5.0000 mg | ORAL_TABLET | Freq: Three times a day (TID) | ORAL | 2 refills | Status: AC | PRN

## 2021-12-09 NOTE — Plan of Care (Signed)
  Problem: Education: Goal: Knowledge of General Education information will improve Description: Including pain rating scale, medication(s)/side effects and non-pharmacologic comfort measures Outcome: Not Progressing   Problem: Health Behavior/Discharge Planning: Goal: Ability to manage health-related needs will improve Outcome: Not Progressing   Problem: Clinical Measurements: Goal: Ability to maintain clinical measurements within normal limits will improve Outcome: Progressing Goal: Will remain free from infection Outcome: Progressing Goal: Diagnostic test results will improve Outcome: Progressing Goal: Respiratory complications will improve Outcome: Progressing Goal: Cardiovascular complication will be avoided Outcome: Progressing   Problem: Activity: Goal: Risk for activity intolerance will decrease Outcome: Progressing   Problem: Nutrition: Goal: Adequate nutrition will be maintained Outcome: Not Progressing   Problem: Coping: Goal: Level of anxiety will decrease Outcome: Not Progressing   Problem: Elimination: Goal: Will not experience complications related to bowel motility Outcome: Progressing Goal: Will not experience complications related to urinary retention Outcome: Progressing   Problem: Pain Managment: Goal: General experience of comfort will improve Outcome: Progressing   Problem: Safety: Goal: Ability to remain free from injury will improve Outcome: Not Progressing   Problem: Skin Integrity: Goal: Risk for impaired skin integrity will decrease Outcome: Progressing

## 2021-12-09 NOTE — Progress Notes (Signed)
Patient sitting up in bed feeding himself ice cream. He states he was hungry.

## 2021-12-09 NOTE — Progress Notes (Signed)
PROGRESS NOTE Bradley Meza  FAO:[id number] DOB: 02-Jul-1923 DOA: 12/06/2021 PCP: Hoyt Koch, MD   Brief Narrative/Hospital Course: 88 year old with worsening CLL with a WBC of 177,COPD, dementia with behavioral disturbance, arthritis, GERD, hyperlipidemia, chronic lower back pain, prostate cancer, renal cysts who is coming in due to decrease oral intake, generalized weakness, decreased appetite, recent fall and worsening dementia.  Patient is admitted for AKI with creatinine 2.7 baseline 1.2-1.4, worsening WBC count, hyperkalemia metabolic acidosis.  He has been made DNR.  Palliative care has been consulted per daughter's request(daughter is a retired family physician).  Patient continues to have agitation with dementia oriented to self only.  Medication adjusted, family meeting held with palliative care and patient's daughter and they elected to pursue inpatient hospice and referred.    Subjective: Seen and examined.  He is resting comfortably.  Able to open his eyes Has not needed anymore Haldol since yesterday evening Overnight afebrile  Assessment and Plan: Principal Problem:   AKI (acute kidney injury) ([place]) Active Problems:   Dementia with behavioral disturbance ([facility])   CLL (chronic lymphocytic leukemia) ([facility])   Hyperkalemia   Macrocytic anemia   AKI on CKDIIIa: Baseline creatinine ranges 1.2-1.4.  Due to poor oral intake-treated with IV fluids creatinine 1.9,.at this time patient is refusing further labs IV stick/IV line.  Plan is to pursue hospice after family meeting.  Hyperkalemia: Due to AKI.  Resolved  Dementia with behavioral disturbance with agitation: Cont fall precaution delirium precaution.  Continue to control sedation with Haldol as needed.   ONG:EXBM-WUX evaluation deferred per family request and Perative care has been consulted patient is extremely elevated WBC count 177k>133k.  WBC 2 years ago was in 30K range Recent Labs  Lab 12/06/21 0947 12/07/21 0628   WBC 176.9* 133.1*    Macrocytic anemia:Suspect in the setting of CLL/chronic disease.  Hemoglobin stable 9 to 10 g.  Goals of care currently DNR, extensive family meeting was held and family pursuing palliative route/hospice.  TOC consulted  DVT prophylaxis:SCDs Start: 12/06/21 1422 Code Status:   Code Status: DNR Family Communication: plan of care discussed with patient at bedside.  Daughter was updated Patient status is: Remains hospitalized due to ongoing AKI,ongoing discussion regarding goals of care Level of care:Med-Surg  Dispo: The patient is from:Home by himself- daughter checking on twice a day.            Anticipated disposition: Hospice once bed available  Mobility Assessment (last 72 hours)     Mobility Assessment     Row Name 12/08/21 2100 12/07/21 2012 12/07/21 1100 12/06/21 2000 12/06/21 1500   Does patient have an order for bedrest or is patient medically unstable No - Continue assessment No - Continue assessment No - Continue assessment No - Continue assessment No - Continue assessment   What is the highest level of mobility based on the progressive mobility assessment? Level 3 (Stands with assist) - Balance while standing  and cannot march in place Level 3 (Stands with assist) - Balance while standing  and cannot march in place Level 3 (Stands with assist) - Balance while standing  and cannot march in place Level 3 (Stands with assist) - Balance while standing  and cannot march in place Level 3 (Stands with assist) - Balance while standing  and cannot march in place   Is the above level different from baseline mobility prior to current illness? Yes - Recommend PT order Yes - Recommend PT order Yes - Recommend PT order Yes -  Recommend PT order Yes - Recommend PT order             Objective: Vitals last 24 hrs: Vitals:   12/07/21 1325 12/07/21 1935 12/08/21 0553 12/08/21 2022  BP: 139/71 (!) 146/75 (!) 152/58 (!) 145/98  Pulse: 65 75 92 99  Resp: '16 16 18   '$ Temp:   98.1 F (36.7 C) 97.7 F (36.5 C) 98.2 F (36.8 C)  TempSrc:  Oral Oral Oral  SpO2: 96% 96% 95% 97%  Weight:      Height:       Weight change:   Physical Examination: General exam: Drowsy, elderly,  HEENT:Oral mucosa moist, Ear/Nose WNL grossly, dentition normal. Respiratory system: bilaterally diminished, no use of accessory muscle Cardiovascular system: S1 & S2 +, No JVD,. Gastrointestinal system: Abdomen soft,NT,ND,BS+ Nervous System:Alert, awake, moving extremities and grossly nonfocal Extremities: LE ankle edema  neg, distal peripheral pulses palpable.  Skin: No rashes,no icterus. MSK: Normal muscle bulk,tone, power   Medications reviewed:  Scheduled Meds:  acetaminophen  650 mg Oral Once  Continuous Infusions:    Diet Order             Diet full liquid Room service appropriate? Yes; Fluid consistency: Thin  Diet effective now                  Intake/Output Summary (Last 24 hours) at 12/09/2021 0836 Last data filed at 12/09/2021 0543 Gross per 24 hour  Intake 0 ml  Output 300 ml  Net -300 ml   Net IO Since Admission: -395.78 mL [12/09/21 0836]  Wt Readings from Last 3 Encounters:  12/06/21 63.5 kg  06/03/19 67.6 kg  04/20/19 81.6 kg     Unresulted Labs (From admission, onward)    None     Data Reviewed: I have personally reviewed following labs and imaging studies CBC: Recent Labs  Lab 12/06/21 0947 12/07/21 0628  WBC 176.9* 133.1*  HGB 10.5* 9.4*  HCT 36.0* 32.6*  MCV 107.8* 110.5*  PLT 91* 73*   Basic Metabolic Panel: Recent Labs  Lab 12/06/21 0947 12/07/21 0628  NA 144 139  K 5.9* 4.3  CL 112* 107  CO2 21* 24  GLUCOSE 148* 108*  BUN 43* 42*  CREATININE 2.72* 1.96*  CALCIUM 9.6 8.8*   GFR: Estimated Creatinine Clearance: 19.8 mL/min (A) (by C-G formula based on SCr of 1.96 mg/dL (H)). Liver Function Tests: Recent Labs  Lab 12/07/21 0628  AST 23  ALT 14  ALKPHOS 73  BILITOT 1.1  PROT 6.4*  ALBUMIN 3.6   No results  for input(s): "LIPASE", "AMYLASE" in the last 168 hours. No results for input(s): "AMMONIA" in the last 168 hours. Coagulation Profile: No results for input(s): "INR", "PROTIME" in the last 168 hours. BNP (last 3 results) No results for input(s): "PROBNP" in the last 8760 hours. HbA1C: No results for input(s): "HGBA1C" in the last 72 hours. CBG: Recent Labs  Lab 12/06/21 1001  GLUCAP 151*   Lipid Profile: No results for input(s): "CHOL", "HDL", "LDLCALC", "TRIG", "CHOLHDL", "LDLDIRECT" in the last 72 hours. Thyroid Function Tests: No results for input(s): "TSH", "T4TOTAL", "FREET4", "T3FREE", "THYROIDAB" in the last 72 hours. Sepsis Labs: No results for input(s): "PROCALCITON", "LATICACIDVEN" in the last 168 hours.  No results found for this or any previous visit (from the past 240 hour(s)).  Antimicrobials: Anti-infectives (From admission, onward)    None      Culture/Microbiology No results found for: "SDES", "SPECREQUEST", "CULT", "REPTSTATUS"  Other culture-see note  Radiology Studies: No results found.   LOS: 2 days   Antonieta Pert, MD [facility]  12/09/2021, 8:36 AM

## 2021-12-09 NOTE — Progress Notes (Signed)
Report called to [facility], spoke to [employer].Shemar has been sleeping most of the day. He woke up for a little while,when his son and daughter were visiting. Then slept for several more hours. He woke up and stated he was hungry. Patient sitting up in bed feeding himself ice cream. Male condom catheter intact, draining amber urine. PT[facility] here to transport patient to [facility]

## 2021-12-09 NOTE — TOC Transition Note (Addendum)
Transition of Care [facility]) - CM/SW Discharge Note   Patient Details  Name: Bradley Meza MRN: [id number] Date of Birth: [birth year]  [facility] CM/SW Contact:  Dessa Phi, RN Phone Number: 12/09/2021, 12:28 PM   Clinical Narrative: Bradley has a bed-awaiting completeion  of her process-she will infomr me when all completed then I will call PTAR-forms are @ nsg station.DNR. Awaiting [name] to inform me of completion prior calling PTAR. -2:15 PTAR called. No further CM needs.     Final next level of care: [place] Barriers to Discharge: No Barriers Identified   Patient Goals and CMS Choice Patient states their goals for this hospitalization and ongoing recovery are:: to back to the house CMS Medicare.gov Compare Post Acute Care list provided to:: Patient Represenative (must comment) (daughter) Choice offered to / list presented to : Adult Children  Discharge Placement              Patient chooses bed at: Other - please specify in the comment section below: ([name]) Patient to be transferred to facility by:  [name]) Name of family member notified:  ([name](dtr)[phone]) Patient and family notified of of transfer: 12/09/21  Discharge Plan and Services   Discharge Planning Services: CM Consult                                 Social Determinants of Health (SDOH) Interventions Food Insecurity Interventions: Patient Refused Utilities Interventions: Patient Refused   Readmission Risk Interventions     No data to display

## 2021-12-09 NOTE — Discharge Summary (Signed)
Physician Discharge Summary  Bradley Meza RJJ:[id number] DOB: 01/10/1924 DOA: 12/06/2021  PCP: Hoyt Koch, MD  Admit date: 12/06/2021 Discharge date: 12/09/2021 Recommendations for Outpatient Follow-up:  Follow up with hospice house  Discharge Dispo: Hospice inpatient Discharge Condition: Stable Code Status:   Code Status: DNR Diet recommendation:  Diet Order             Diet full liquid Room service appropriate? Yes; Fluid consistency: Thin  Diet effective now                    Brief/Interim Summary: 88 year old with worsening CLL with a WBC of 177,COPD, dementia with behavioral disturbance, arthritis, GERD, hyperlipidemia, chronic lower back pain, prostate cancer, renal cysts who is coming in due to decrease oral intake, generalized weakness, decreased appetite, recent fall and worsening dementia.  Patient is admitted for AKI with creatinine 2.7 baseline 1.2-1.4, worsening WBC count, hyperkalemia metabolic acidosis.  He has been made DNR.  Palliative care has been consulted per daughter's request(daughter is a retired family physician).  Patient continues to have agitation with dementia oriented to self only.  Medication adjusted, family meeting held with palliative care and patient's daughter and they elected to pursue inpatient hospice and referred.  He has been accepted to hospice, will be discharged once bed available.  He has bed available today and daughter has accepted.  Discharge Diagnoses:  Principal Problem:   AKI (acute kidney injury) ([place]) Active Problems:   Dementia with behavioral disturbance ([facility])   CLL (chronic lymphocytic leukemia) (H[facility])   Hyperkalemia   Macrocytic anemia  AKI on CKDIIIa: Baseline creatinine ranges 1.2-1.4.  Due to poor oral intake-treated with IV fluids creatinine 1.9,.at this time patient is refusing further labs IV stick/IV line.  Plan is to pursue hospice after family meeting.  Hyperkalemia: Due to AKI.  Resolved   Dementia with  behavioral disturbance with agitation: Cont fall precaution delirium precaution.  Continue to control sedation with Haldol as needed.    KZS:WFUX-NAT evaluation deferred per family request and Perative care has been consulted patient is extremely elevated WBC count 177k>133k.  WBC 2 years ago was in 30K range Macrocytic anemia:Suspect in the setting of CLL/chronic disease.  Hemoglobin stable 9 to 10 g.   Goals of care currently DNR, extensive family meeting was held and family pursuing palliative route/hospice.  TOC consulted  Consults: Palliative care Subjective: Sleepy.  Discharge Exam: Vitals:   12/08/21 0553 12/08/21 2022  BP: (!) 152/58 (!) 145/98  Pulse: 92 99  Resp: 18   Temp: 97.7 F (36.5 C) 98.2 F (36.8 C)  SpO2: 95% 97%   General: Pt is sleepy, not in acute distress Cardiovascular: RRR, S1/S2 +, no rubs, no gallops Respiratory: CTA bilaterally, no wheezing, no rhonchi Abdominal: Soft, NT, ND, bowel sounds + Extremities: no edema, no cyanosis  Discharge Instructions  Discharge Instructions     Discharge instructions   Complete by: As directed    Follow-up with hospice      Allergies as of 12/09/2021   No Known Allergies      Medication List     TAKE these medications    haloperidol 5 MG tablet Commonly known as: HALDOL Take 1 tablet (5 mg total) by mouth every 8 (eight) hours as needed for agitation.        No Known Allergies  The results of significant diagnostics from this hospitalization (including imaging, microbiology, ancillary and laboratory) are listed below for reference.    Microbiology:  No results found for this or any previous visit (from the past 240 hour(s)).  Procedures/Studies: No results found.  Labs: BNP (last 3 results) No results for input(s): "BNP" in the last 8760 hours. Basic Metabolic Panel: Recent Labs  Lab 12/06/21 0947 12/07/21 0628  NA 144 139  K 5.9* 4.3  CL 112* 107  CO2 21* 24  GLUCOSE 148* 108*   BUN 43* 42*  CREATININE 2.72* 1.96*  CALCIUM 9.6 8.8*   Liver Function Tests: Recent Labs  Lab 12/07/21 0628  AST 23  ALT 14  ALKPHOS 73  BILITOT 1.1  PROT 6.4*  ALBUMIN 3.6   No results for input(s): "LIPASE", "AMYLASE" in the last 168 hours. No results for input(s): "AMMONIA" in the last 168 hours. CBC: Recent Labs  Lab 12/06/21 0947 12/07/21 0628  WBC 176.9* 133.1*  HGB 10.5* 9.4*  HCT 36.0* 32.6*  MCV 107.8* 110.5*  PLT 91* 73*   Cardiac Enzymes: No results for input(s): "CKTOTAL", "CKMB", "CKMBINDEX", "TROPONINI" in the last 168 hours. BNP: Invalid input(s): "POCBNP" CBG: Recent Labs  Lab 12/06/21 1001  GLUCAP 151*   D-Dimer No results for input(s): "DDIMER" in the last 72 hours. Hgb A1c No results for input(s): "HGBA1C" in the last 72 hours. Lipid Profile No results for input(s): "CHOL", "HDL", "LDLCALC", "TRIG", "CHOLHDL", "LDLDIRECT" in the last 72 hours. Thyroid function studies No results for input(s): "TSH", "T4TOTAL", "T3FREE", "THYROIDAB" in the last 72 hours.  Invalid input(s): "FREET3" Anemia work up No results for input(s): "VITAMINB12", "FOLATE", "FERRITIN", "TIBC", "IRON", "RETICCTPCT" in the last 72 hours. Urinalysis    Component Value Date/Time   COLORURINE YELLOW 12/07/2021 0337   APPEARANCEUR CLOUDY (A) 12/07/2021 0337   LABSPEC 1.018 12/07/2021 0337   PHURINE 5.0 12/07/2021 0337   GLUCOSEU NEGATIVE 12/07/2021 0337   HGBUR LARGE (A) 12/07/2021 0337   BILIRUBINUR NEGATIVE 12/07/2021 0337   KETONESUR NEGATIVE 12/07/2021 0337   PROTEINUR 30 (A) 12/07/2021 0337   NITRITE NEGATIVE 12/07/2021 0337   LEUKOCYTESUR NEGATIVE 12/07/2021 0337   Sepsis Labs Recent Labs  Lab 12/06/21 0947 12/07/21 0628  WBC 176.9* 133.1*   Microbiology No results found for this or any previous visit (from the past 240 hour(s)). Time coordinating discharge: 25 minutes  SIGNED: Antonieta Pert, MD  [facility] 12/09/2021, 11:16 AM  If 7PM-7AM,  please contact night-coverage [email or web address]

## 2021-12-09 NOTE — Progress Notes (Signed)
Daily Progress Note   Patient Name: Bradley Meza       Date: 12/09/2021 DOB: 07/20/1923  Age: 88 y.o. MRN#: [id number] Attending Physician: Antonieta Pert, MD Primary Care Physician: Hoyt Koch, MD Admit Date: 12/06/2021  Reason for Consultation/Follow-up: Establishing goals of care  Subjective: Patient is resting in bed, opens eyes but is not as alert, daughter at bedside  Length of Stay: 2  Current Medications: Scheduled Meds:   acetaminophen  650 mg Oral Once    Continuous Infusions:   PRN Meds: acetaminophen **OR** acetaminophen, haloperidol **OR** haloperidol lactate, ondansetron **OR** ondansetron (ZOFRAN) IV  Physical Exam         Elderly gentleman Confused and agitated Safety sitter at bedside  Vital Signs: BP (!) 145/98 (BP Location: Right Arm)   Pulse 99   Temp 98.2 F (36.8 C) (Oral)   Resp 18   Ht '5\' 8"'$  (1.727 m)   Wt 63.5 kg   SpO2 97%   BMI 21.29 kg/m  SpO2: SpO2: 97 % O2 Device: O2 Device: Room Air O2 Flow Rate:    Intake/output summary:  Intake/Output Summary (Last 24 hours) at 12/09/2021 1355 Last data filed at 12/09/2021 0543 Gross per 24 hour  Intake 0 ml  Output 300 ml  Net -300 ml    LBM:   Baseline Weight: Weight: 63.5 kg Most recent weight: Weight: 63.5 kg       Palliative Assessment/Data:      Patient Active Problem List   Diagnosis Date Noted   AKI (acute kidney injury) ([place]) 12/06/2021   Hyperkalemia 12/06/2021   Macrocytic anemia 12/06/2021   Dementia with behavioral disturbance ([facility]) 06/04/2019   CLL (chronic lymphocytic leukemia) ([place]) 06/04/2019   Aggressive behavior, adult 04/20/2019   Routine general medical examination at a health care facility 03/14/2015   Kidney stone 08/09/2013   PROSTATE CANCER  05/18/2009    Palliative Care Assessment & Plan   Patient Profile:   Assessment:  88 y.o. male  with past medical history of CLL (2021 diagnosis), COPD, dementia, arthritis, GERD, HLD, chronic low back pain, prostate cancer, and renal cysts, and recent fall at home admitted on 12/06/2021 with decreased p.o. intake, weakness status post fall, and progression of dementia.   During course of hospitalization, CT and ultrasound of kidneys have been declined by  family.  As per chart review, family wishes to pursue palliative measures.   Palliative medicine team was consulted to discuss goals of care..   Recommendations/Plan: Comfort measures Residential hospice Await bed availability at local hospice facility Discussed with daughter Bradley Meza who is at bedside     Code Status:    Code Status Orders  (From admission, onward)           Start     Ordered   12/06/21 1421  Do not attempt resuscitation (DNR)  Continuous       Question Answer Comment  In the event of cardiac or respiratory ARREST Do not call a "code blue"   In the event of cardiac or respiratory ARREST Do not perform Intubation, CPR, defibrillation or ACLS   In the event of cardiac or respiratory ARREST Use medication by any route, position, wound care, and other measures to relive pain and suffering. May use oxygen, suction and manual treatment of airway obstruction as needed for comfort.   Comments dnr and dni per daughter      12/06/21 1421           Code Status History     Date Active Date Inactive Code Status Order ID Comments User Context   12/06/2021 1336 12/06/2021 1421 DNR [id number]  Varney Biles, MD ED       Prognosis:  < 2 weeks  Discharge Planning: Hospice facility  Care plan was discussed with daughter Dr Leward Quan.   Thank you for allowing the Palliative Medicine Team to assist in the care of this patient.  low MDM.      Greater than 50%  of this time was spent counseling and  coordinating care related to the above assessment and plan.  Loistine Chance, MD  Please contact Palliative Medicine Team phone at [phone] for questions and concerns.

## 2021-12-09 NOTE — Progress Notes (Signed)
[facility]) Hospital Liaison Note  Bed is available at [facility], offered and accepted by patient's daughter, [name].    Eligibility confirmed per [facility] MD. [name] is agreeable to transfer today.   Dessa Phi, RN, TOC aware.  RN, please call report to [phone] prior to patient leaving the unit.  Please send signed and completed DNR with patient at discharge.   Thank you,  Zigmund Gottron RN  [facility] Liaison [phone]

## 2021-12-30 DEATH — deceased
